# Patient Record
Sex: Female | Born: 1950 | Race: Asian | Hispanic: No | Marital: Married | Smoking: Never smoker
Health system: Southern US, Community
[De-identification: ages and names within clinical notes are randomized; demographics above are authoritative.]

## PROBLEM LIST (undated history)

## (undated) DIAGNOSIS — I10 Essential (primary) hypertension: Secondary | ICD-10-CM

## (undated) HISTORY — DX: Essential (primary) hypertension: I10

---

## 2003-03-15 ENCOUNTER — Other Ambulatory Visit: Admission: RE | Admit: 2003-03-15 | Discharge: 2003-03-15 | Payer: Self-pay | Admitting: Obstetrics and Gynecology

## 2004-10-30 ENCOUNTER — Ambulatory Visit: Payer: Self-pay | Admitting: Family Medicine

## 2004-11-05 ENCOUNTER — Ambulatory Visit: Payer: Self-pay | Admitting: Family Medicine

## 2004-12-06 ENCOUNTER — Ambulatory Visit: Payer: Self-pay | Admitting: Family Medicine

## 2004-12-21 ENCOUNTER — Encounter: Admission: RE | Admit: 2004-12-21 | Discharge: 2004-12-21 | Payer: Self-pay | Admitting: Family Medicine

## 2005-01-07 ENCOUNTER — Ambulatory Visit: Payer: Self-pay | Admitting: Family Medicine

## 2005-03-27 ENCOUNTER — Other Ambulatory Visit: Admission: RE | Admit: 2005-03-27 | Discharge: 2005-03-27 | Payer: Self-pay | Admitting: Family Medicine

## 2005-03-27 ENCOUNTER — Encounter (INDEPENDENT_AMBULATORY_CARE_PROVIDER_SITE_OTHER): Payer: Self-pay | Admitting: *Deleted

## 2005-03-27 ENCOUNTER — Ambulatory Visit: Payer: Self-pay | Admitting: Family Medicine

## 2006-01-13 ENCOUNTER — Encounter: Admission: RE | Admit: 2006-01-13 | Discharge: 2006-01-13 | Payer: Self-pay | Admitting: Family Medicine

## 2006-03-04 ENCOUNTER — Other Ambulatory Visit: Admission: RE | Admit: 2006-03-04 | Discharge: 2006-03-04 | Payer: Self-pay | Admitting: Family Medicine

## 2006-03-04 ENCOUNTER — Ambulatory Visit: Payer: Self-pay | Admitting: Family Medicine

## 2006-03-04 ENCOUNTER — Encounter (INDEPENDENT_AMBULATORY_CARE_PROVIDER_SITE_OTHER): Payer: Self-pay | Admitting: *Deleted

## 2006-03-04 LAB — CONVERTED CEMR LAB
ALT: 23 units/L (ref 0–40)
AST: 26 units/L (ref 0–37)
Albumin: 3.6 g/dL (ref 3.5–5.2)
Alkaline Phosphatase: 69 units/L (ref 39–117)
BUN: 13 mg/dL (ref 6–23)
CO2: 31 meq/L (ref 19–32)
Calcium: 9.6 mg/dL (ref 8.4–10.5)
Chloride: 106 meq/L (ref 96–112)
Chol/HDL Ratio, serum: 4.6
Cholesterol: 210 mg/dL (ref 0–200)
Creatinine, Ser: 0.9 mg/dL (ref 0.4–1.2)
GFR calc non Af Amer: 70 mL/min
Glomerular Filtration Rate, Af Am: 84 mL/min/{1.73_m2}
Glucose, Bld: 114 mg/dL — ABNORMAL HIGH (ref 70–99)
HCT: 38.3 % (ref 36.0–46.0)
HDL: 45.2 mg/dL (ref >39.0)
Hemoglobin: 13.2 g/dL (ref 12.0–15.0)
Hgb A1c MFr Bld: 5.7 % (ref 4.6–6.0)
LDL DIRECT: 119.2 mg/dL
MCHC: 34.4 g/dL (ref 30.0–36.0)
MCV: 90.8 fL (ref 78.0–100.0)
Platelets: 235 10*3/uL (ref 150–400)
Potassium: 4.2 meq/L (ref 3.5–5.1)
RBC: 4.21 M/uL (ref 3.87–5.11)
RDW: 12 % (ref 11.5–14.6)
Sodium: 142 meq/L (ref 135–145)
TSH: 1.55 microintl units/mL (ref 0.35–5.50)
Total Bilirubin: 0.8 mg/dL (ref 0.3–1.2)
Total Protein: 7.3 g/dL (ref 6.0–8.3)
Triglyceride fasting, serum: 243 mg/dL (ref 0–149)
VLDL: 49 mg/dL — ABNORMAL HIGH (ref 0–40)
WBC: 5 10*3/uL (ref 4.5–10.5)

## 2007-01-15 ENCOUNTER — Encounter: Admission: RE | Admit: 2007-01-15 | Discharge: 2007-01-15 | Payer: Self-pay | Admitting: Family Medicine

## 2008-01-19 ENCOUNTER — Encounter: Admission: RE | Admit: 2008-01-19 | Discharge: 2008-01-19 | Payer: Self-pay | Admitting: Family Medicine

## 2009-01-23 ENCOUNTER — Ambulatory Visit (HOSPITAL_COMMUNITY): Admission: RE | Admit: 2009-01-23 | Discharge: 2009-01-23 | Payer: Self-pay | Admitting: Family Medicine

## 2010-02-14 ENCOUNTER — Ambulatory Visit (HOSPITAL_COMMUNITY): Admission: RE | Admit: 2010-02-14 | Discharge: 2010-02-14 | Payer: Self-pay | Admitting: Internal Medicine

## 2010-02-19 ENCOUNTER — Encounter: Payer: Self-pay | Admitting: Family Medicine

## 2010-02-19 LAB — CONVERTED CEMR LAB
ALT: 11 units/L (ref 0–35)
AST: 17 units/L (ref 0–37)
Albumin: 4.5 g/dL (ref 3.5–5.2)
Alkaline Phosphatase: 75 units/L (ref 39–117)
BUN: 16 mg/dL (ref 6–23)
Basophils Absolute: 0 10*3/uL (ref 0.0–0.1)
Basophils Relative: 0 % (ref 0–1)
CO2: 27 meq/L (ref 19–32)
Calcium: 10 mg/dL (ref 8.4–10.5)
Chloride: 105 meq/L (ref 96–112)
Cholesterol: 221 mg/dL — ABNORMAL HIGH (ref 0–200)
Creatinine, Ser: 0.73 mg/dL (ref 0.40–1.20)
Eosinophils Absolute: 0.1 10*3/uL (ref 0.0–0.7)
Eosinophils Relative: 2 % (ref 0–5)
Glucose, Bld: 98 mg/dL (ref 70–99)
HCT: 41.2 % (ref 36.0–46.0)
HDL: 54 mg/dL (ref >39)
Hemoglobin: 13.3 g/dL (ref 12.0–15.0)
LDL Cholesterol: 139 mg/dL — ABNORMAL HIGH (ref 0–99)
Lymphocytes Relative: 51 % — ABNORMAL HIGH (ref 12–46)
Lymphs Abs: 2.6 10*3/uL (ref 0.7–4.0)
MCHC: 32.3 g/dL (ref 30.0–36.0)
MCV: 93.2 fL (ref 78.0–100.0)
Monocytes Absolute: 0.3 10*3/uL (ref 0.1–1.0)
Monocytes Relative: 6 % (ref 3–12)
Neutro Abs: 2.1 10*3/uL (ref 1.7–7.7)
Neutrophils Relative %: 41 % — ABNORMAL LOW (ref 43–77)
Platelets: 247 10*3/uL (ref 150–400)
Potassium: 4.8 meq/L (ref 3.5–5.3)
RBC: 4.42 M/uL (ref 3.87–5.11)
RDW: 13 % (ref 11.5–15.5)
Sodium: 141 meq/L (ref 135–145)
TSH: 1.328 microintl units/mL (ref 0.350–4.500)
Total Bilirubin: 0.6 mg/dL (ref 0.3–1.2)
Total CHOL/HDL Ratio: 4.1
Total Protein: 7.7 g/dL (ref 6.0–8.3)
Triglycerides: 140 mg/dL (ref <150)
VLDL: 28 mg/dL (ref 0–40)
WBC: 5.1 10*3/uL (ref 4.0–10.5)

## 2011-01-09 ENCOUNTER — Other Ambulatory Visit (HOSPITAL_COMMUNITY): Payer: Self-pay | Admitting: Family Medicine

## 2011-01-09 DIAGNOSIS — Z1231 Encounter for screening mammogram for malignant neoplasm of breast: Secondary | ICD-10-CM

## 2011-02-18 ENCOUNTER — Ambulatory Visit (HOSPITAL_COMMUNITY)
Admission: RE | Admit: 2011-02-18 | Discharge: 2011-02-18 | Disposition: A | Discharge status: Home / Self Care | Payer: Self-pay | Source: Ambulatory Visit | Attending: Family Medicine | Admitting: Family Medicine

## 2011-02-18 DIAGNOSIS — Z1231 Encounter for screening mammogram for malignant neoplasm of breast: Secondary | ICD-10-CM | POA: Insufficient documentation

## 2011-02-22 ENCOUNTER — Other Ambulatory Visit: Payer: Self-pay | Admitting: Family Medicine

## 2011-02-22 DIAGNOSIS — R928 Other abnormal and inconclusive findings on diagnostic imaging of breast: Secondary | ICD-10-CM

## 2011-03-11 ENCOUNTER — Ambulatory Visit
Admission: RE | Admit: 2011-03-11 | Discharge: 2011-03-11 | Disposition: A | Discharge status: Home / Self Care | Payer: Self-pay | Source: Ambulatory Visit | Attending: Family Medicine | Admitting: Family Medicine

## 2011-03-11 DIAGNOSIS — R928 Other abnormal and inconclusive findings on diagnostic imaging of breast: Secondary | ICD-10-CM

## 2013-04-07 ENCOUNTER — Ambulatory Visit: Payer: No Typology Code available for payment source | Attending: Internal Medicine

## 2013-05-03 ENCOUNTER — Ambulatory Visit: Payer: Self-pay | Admitting: Internal Medicine

## 2013-06-03 ENCOUNTER — Encounter: Payer: Self-pay | Admitting: Family Medicine

## 2013-06-03 ENCOUNTER — Ambulatory Visit: Payer: No Typology Code available for payment source | Attending: Internal Medicine | Admitting: Family Medicine

## 2013-06-03 VITALS — BP 179/81 | HR 89 | Temp 98.6°F | Resp 14 | Ht 62.0 in | Wt 115.2 lb

## 2013-06-03 DIAGNOSIS — Z0000 Encounter for general adult medical examination without abnormal findings: Secondary | ICD-10-CM

## 2013-06-03 DIAGNOSIS — E785 Hyperlipidemia, unspecified: Secondary | ICD-10-CM

## 2013-06-03 LAB — CBC WITH DIFFERENTIAL/PLATELET
Basophils Absolute: 0 10*3/uL (ref 0.0–0.1)
Basophils Relative: 0 % (ref 0–1)
Eosinophils Absolute: 0.1 10*3/uL (ref 0.0–0.7)
Eosinophils Relative: 2 % (ref 0–5)
HCT: 40.5 % (ref 36.0–46.0)
Hemoglobin: 13.9 g/dL (ref 12.0–15.0)
Lymphocytes Relative: 43 % (ref 12–46)
Lymphs Abs: 2.9 10*3/uL (ref 0.7–4.0)
MCH: 30.3 pg (ref 26.0–34.0)
MCHC: 34.3 g/dL (ref 30.0–36.0)
MCV: 88.2 fL (ref 78.0–100.0)
Monocytes Absolute: 0.5 10*3/uL (ref 0.1–1.0)
Monocytes Relative: 7 % (ref 3–12)
NEUTROS PCT: 48 % (ref 43–77)
Neutro Abs: 3.2 10*3/uL (ref 1.7–7.7)
PLATELETS: 257 10*3/uL (ref 150–400)
RBC: 4.59 MIL/uL (ref 3.87–5.11)
RDW: 14.1 % (ref 11.5–15.5)
WBC: 6.8 10*3/uL (ref 4.0–10.5)

## 2013-06-03 LAB — POCT CBG (FASTING - GLUCOSE)-MANUAL ENTRY: Glucose Fasting, POC: 97 mg/dL (ref 70–99)

## 2013-06-03 MED ORDER — LOSARTAN POTASSIUM 100 MG PO TABS: 100.0000 mg | ORAL_TABLET | Freq: Every day | ORAL | Status: DC

## 2013-06-03 MED ORDER — DILTIAZEM HCL ER BEADS 240 MG PO CP24: 240.0000 mg | ORAL_CAPSULE | Freq: Every day | ORAL | Status: DC

## 2013-06-03 MED ORDER — PRAVASTATIN SODIUM 20 MG PO TABS: 20.0000 mg | ORAL_TABLET | Freq: Every day | ORAL | Status: DC

## 2013-06-03 NOTE — Progress Notes (Unsigned)
   Subjective:    Patient ID: Shannon Robinson, female    DOB: 09/05/1950, 63 y.o.   MRN: [id number]  HPI Pt here to establish care and have cpe. She has a h/o htn and hyperlipidemia, otherwise is healthy.   BP not well controlled. Has not had labs checked in over a year.  Has not had tdap, glucose screen, vit d screen, hep C screen, dexa, mammo, or c-scope. Says had pap last year.  Feels well, no concerns. With htn no headache shob or chest pain. No blurry vision.   Review of Systems A 12 point review of systems is negative except as per hpi.       Objective:   Physical Exam Nursing note and vitals reviewed. Constitutional: She is oriented to person, place, and time. She appears well-developed and well-nourished.  HENT:  Right Ear: External ear normal.  Left Ear: External ear normal.  Nose: Nose normal.  Mouth/Throat: Oropharynx is clear and moist. No oropharyngeal exudate.  Eyes: Conjunctivae are normal. Pupils are equal, round, and reactive to light.  Neck: Normal range of motion. Neck supple. No thyromegaly present.  Cardiovascular: Normal rate, regular rhythm and normal heart sounds.   Pulmonary/Chest: Effort normal and breath sounds normal.  Abdominal: Soft. Bowel sounds are normal. She exhibits no distension. There is no tenderness. There is no rebound.  Lymphadenopathy:    She has no cervical adenopathy.  Neurological: She is alert and oriented to person, place, and time. She has normal reflexes.  Skin: Skin is warm and dry.  Psychiatric: She has a normal mood and affect. Her behavior is normal.         Assessment & Plan:  Shannon Robinson was seen today for establish care.  Diagnoses and associated orders for this visit:  Preventative health care - DT Vaccine greater than 7yo IM - CBC with Differential - Comprehensive metabolic panel - Glucose (CBG), Fasting - Vitamin D, 25-hydroxy - TSH - Lipid Panel - Hepatitis C antibody - DG Bone Density; Future - Ambulatory referral  to Gastroenterology - MM Digital Screening; Future  Other and unspecified hyperlipidemia - : pravastatin (PRAVACHOL) 20 MG tablet; Take 1 tablet (20 mg total) by mouth daily.  Other Orders -  pravastatin (PRAVACHOL) 20 MG tablet; Take 1 tablet (20 mg total) by mouth daily. - losartan (COZAAR) 100 MG tablet; Take 1 tablet (100 mg total) by mouth daily. - diltiazem (TIAZAC) 240 MG 24 hr capsule; Take 1 capsule (240 mg total) by mouth daily.

## 2013-06-03 NOTE — Patient Instructions (Signed)

## 2013-06-03 NOTE — Progress Notes (Unsigned)
Patient is here to establish care. Requests a physical exam and and lab work for Lipid, CMP, diabetes, Thyroid. Also requests refills on her medications. Patient has an interpreter.

## 2013-06-04 ENCOUNTER — Other Ambulatory Visit: Payer: Self-pay | Admitting: *Deleted

## 2013-06-04 DIAGNOSIS — E7800 Pure hypercholesterolemia, unspecified: Secondary | ICD-10-CM

## 2013-06-04 DIAGNOSIS — E785 Hyperlipidemia, unspecified: Secondary | ICD-10-CM

## 2013-06-04 LAB — COMPREHENSIVE METABOLIC PANEL
ALBUMIN: 4 g/dL (ref 3.5–5.2)
ALT: 12 U/L (ref 0–35)
AST: 18 U/L (ref 0–37)
Alkaline Phosphatase: 67 U/L (ref 39–117)
BUN: 12 mg/dL (ref 6–23)
CALCIUM: 9.7 mg/dL (ref 8.4–10.5)
CO2: 31 meq/L (ref 19–32)
Chloride: 104 mEq/L (ref 96–112)
Creat: 0.65 mg/dL (ref 0.50–1.10)
Glucose, Bld: 92 mg/dL (ref 70–99)
POTASSIUM: 4.5 meq/L (ref 3.5–5.3)
Sodium: 142 mEq/L (ref 135–145)
TOTAL PROTEIN: 7.4 g/dL (ref 6.0–8.3)
Total Bilirubin: 0.6 mg/dL (ref 0.2–1.2)

## 2013-06-04 LAB — VITAMIN D 25 HYDROXY (VIT D DEFICIENCY, FRACTURES): Vit D, 25-Hydroxy: 19 ng/mL — ABNORMAL LOW (ref 30–89)

## 2013-06-04 LAB — TSH: TSH: 1.668 u[IU]/mL (ref 0.350–4.500)

## 2013-06-04 LAB — LIPID PANEL
Cholesterol: 245 mg/dL — ABNORMAL HIGH (ref 0–200)
HDL: 50 mg/dL (ref >39)
LDL Cholesterol: 119 mg/dL — ABNORMAL HIGH (ref 0–99)
Total CHOL/HDL Ratio: 4.9 Ratio
Triglycerides: 378 mg/dL — ABNORMAL HIGH (ref <150)
VLDL: 76 mg/dL — ABNORMAL HIGH (ref 0–40)

## 2013-06-04 LAB — HEPATITIS C ANTIBODY: HCV AB: NEGATIVE

## 2013-06-04 MED ORDER — PRAVASTATIN SODIUM 20 MG PO TABS: 20.0000 mg | ORAL_TABLET | Freq: Every day | ORAL | Status: DC

## 2013-06-09 ENCOUNTER — Telehealth: Payer: Self-pay

## 2013-06-09 ENCOUNTER — Other Ambulatory Visit: Payer: Self-pay | Admitting: Family Medicine

## 2013-06-09 DIAGNOSIS — E559 Vitamin D deficiency, unspecified: Secondary | ICD-10-CM

## 2013-06-09 DIAGNOSIS — E7800 Pure hypercholesterolemia, unspecified: Secondary | ICD-10-CM

## 2013-06-09 DIAGNOSIS — E785 Hyperlipidemia, unspecified: Secondary | ICD-10-CM

## 2013-06-09 MED ORDER — CHOLECALCIFEROL 1.25 MG (50000 UT) PO CAPS: ORAL_CAPSULE | ORAL | Status: DC

## 2013-06-09 MED ORDER — PRAVASTATIN SODIUM 40 MG PO TABS: 20.0000 mg | ORAL_TABLET | Freq: Every day | ORAL | Status: DC

## 2013-06-09 NOTE — Telephone Encounter (Signed)
Interpreter line used Patient not available Message left to return call

## 2013-06-09 NOTE — Telephone Encounter (Signed)
Message copied by Lestine MountJUAREZ, Ryen Rhames L on Wed Jun 09, 2013 12:24 PM ------      Message from: Acey LavWOOD, ALLISON L      Created: Wed Jun 09, 2013 12:06 PM       Please let pt know vit D low and cholesterol high. im sending in meds for her vit D and will increase her statin. F/u as scheduled. Thanks AW ------

## 2013-07-01 ENCOUNTER — Ambulatory Visit (HOSPITAL_COMMUNITY)
Admission: RE | Admit: 2013-07-01 | Discharge: 2013-07-01 | Disposition: A | Discharge status: Home / Self Care | Payer: No Typology Code available for payment source | Source: Ambulatory Visit | Attending: Family Medicine | Admitting: Family Medicine

## 2013-07-01 DIAGNOSIS — Z0000 Encounter for general adult medical examination without abnormal findings: Secondary | ICD-10-CM

## 2013-07-01 DIAGNOSIS — Z1231 Encounter for screening mammogram for malignant neoplasm of breast: Secondary | ICD-10-CM | POA: Insufficient documentation

## 2013-07-01 DIAGNOSIS — Z780 Asymptomatic menopausal state: Secondary | ICD-10-CM | POA: Insufficient documentation

## 2013-07-01 DIAGNOSIS — Z13820 Encounter for screening for osteoporosis: Secondary | ICD-10-CM | POA: Insufficient documentation

## 2013-07-22 ENCOUNTER — Telehealth: Payer: Self-pay

## 2013-07-22 NOTE — Telephone Encounter (Signed)
Interpreter line used Patient not available Unable to leave message No voice mail

## 2013-07-22 NOTE — Telephone Encounter (Signed)
Message copied by Lestine MountJUAREZ, Leani Myron L on Thu Jul 22, 2013  2:30 PM ------      Message from: Acey LavWOOD, ALLISON L      Created: Wed Jul 21, 2013  8:17 PM       Please let pt know she has osteopenia. She should take a daily calcium with D if not already. F/u at [facility] to discuss options to avoid opsteoporosis. ------

## 2013-12-01 ENCOUNTER — Ambulatory Visit: Payer: Self-pay | Admitting: Internal Medicine

## 2013-12-14 ENCOUNTER — Ambulatory Visit: Payer: No Typology Code available for payment source | Attending: Internal Medicine | Admitting: Internal Medicine

## 2013-12-14 VITALS — BP 171/85 | HR 75 | Temp 97.9°F | Wt 112.6 lb

## 2013-12-14 DIAGNOSIS — E785 Hyperlipidemia, unspecified: Secondary | ICD-10-CM | POA: Insufficient documentation

## 2013-12-14 DIAGNOSIS — I10 Essential (primary) hypertension: Secondary | ICD-10-CM | POA: Insufficient documentation

## 2013-12-14 MED ORDER — SIMVASTATIN 10 MG PO TABS: 10.0000 mg | ORAL_TABLET | Freq: Every day | ORAL | Status: DC

## 2013-12-14 MED ORDER — DILTIAZEM HCL ER BEADS 300 MG PO CP24: 300.0000 mg | ORAL_CAPSULE | Freq: Every day | ORAL | Status: DC

## 2013-12-14 MED ORDER — LOSARTAN POTASSIUM 100 MG PO TABS: 100.0000 mg | ORAL_TABLET | Freq: Every day | ORAL | Status: DC

## 2013-12-14 NOTE — Progress Notes (Signed)
MRN: [id number] Name: Shannon Robinson  Sex: female Age: 63 y.o. DOB: [birth year]  Allergies: Review of patient's allergies indicates no known allergies.  Chief Complaint  Patient presents with  . Follow-up    HPI: Patient is 63 y.o. female who has to of hypertension hyperlipidemia comes today for followup, as per patient she has been taking her medications regularly her blood pressure is borderline elevated, she denies any headache dizziness chest and shortness of breath currently she's taking Cozaar 100 mg daily and diltiazem 240 mg daily, she denies any history of chest pain shortness of breath or any palpitation. Patient denies smoking cigarettes.  Past Medical History  Diagnosis Date  . Hypertension     No past surgical history on file.    Medication List       This list is accurate as of: 12/14/13 12:45 PM.  Always use your most recent med list.               Cholecalciferol 50000 UNITS capsule  One weekly for 8 weeks then recheck     diltiazem 300 MG 24 hr capsule  Commonly known as:  TIAZAC  Take 1 capsule (300 mg total) by mouth daily.     losartan 100 MG tablet  Commonly known as:  COZAAR  Take 1 tablet (100 mg total) by mouth daily.     simvastatin 10 MG tablet  Commonly known as:  ZOCOR  Take 1 tablet (10 mg total) by mouth daily.        Meds ordered this encounter  Medications  . DISCONTD: simvastatin (ZOCOR) 10 MG tablet    Sig: Take 10 mg by mouth daily.  Marland Kitchen losartan (COZAAR) 100 MG tablet    Sig: Take 1 tablet (100 mg total) by mouth daily.    Dispense:  90 tablet    Refill:  1  . diltiazem (TIAZAC) 300 MG 24 hr capsule    Sig: Take 1 capsule (300 mg total) by mouth daily.    Dispense:  90 capsule    Refill:  1  . simvastatin (ZOCOR) 10 MG tablet    Sig: Take 1 tablet (10 mg total) by mouth daily.    Dispense:  30 tablet    Refill:  3    There is no immunization history for the selected administration types on file for this patient.  No  family history on file.  History  Substance Use Topics  . Smoking status: Never Smoker   . Smokeless tobacco: Not on file  . Alcohol Use: No    Review of Systems   As noted in HPI  Filed Vitals:   12/14/13 1221  BP: 171/85  Pulse: 75  Temp: 97.9 F (36.6 C)    Physical Exam  Physical Exam  Constitutional: No distress.  Eyes: EOM are normal. Pupils are equal, round, and reactive to light.  Cardiovascular: Normal rate and regular rhythm.   Pulmonary/Chest: Breath sounds normal. No respiratory distress. She has no wheezes. She has no rales.  Musculoskeletal: She exhibits no edema.    CBC    Component Value Date/Time   WBC 6.8 06/03/2013 1032   RBC 4.59 06/03/2013 1032   HGB 13.9 06/03/2013 1032   HCT 40.5 06/03/2013 1032   PLT 257 06/03/2013 1032   MCV 88.2 06/03/2013 1032   LYMPHSABS 2.9 06/03/2013 1032   MONOABS 0.5 06/03/2013 1032   EOSABS 0.1 06/03/2013 1032   BASOSABS 0.0 06/03/2013 1032    CMP  Component Value Date/Time   NA 142 06/03/2013 1032   K 4.5 06/03/2013 1032   CL 104 06/03/2013 1032   CO2 31 06/03/2013 1032   GLUCOSE 92 06/03/2013 1032   GLUCOSE 114* 03/04/2006 1145   BUN 12 06/03/2013 1032   CREATININE 0.65 06/03/2013 1032   CREATININE 0.73 02/19/2010 2203   CALCIUM 9.7 06/03/2013 1032   PROT 7.4 06/03/2013 1032   ALBUMIN 4.0 06/03/2013 1032   AST 18 06/03/2013 1032   ALT 12 06/03/2013 1032   ALKPHOS 67 06/03/2013 1032   BILITOT 0.6 06/03/2013 1032   GFRNONAA 70 03/04/2006 1145    Lab Results  Component Value Date/Time   CHOL 245* 06/03/2013 10:32 AM    No components found with this basename: hga1c    Lab Results  Component Value Date/Time   AST 18 06/03/2013 10:32 AM    Assessment and Plan  Other and unspecified hyperlipidemia - Plan: , Currently patient is on simvastatin (ZOCOR) 10 MG tablet, will repeat fasting Lipid panel  Essential hypertension, benign - Plan:  Blood pressure is uncontrolled, I have advised patient for DASH diet,  she'll continue with losartan (COZAAR) 100 MG tablet, I have increased the dose of diltiazem (TIAZAC) 300 MG 24 hr capsule will repeat blood chemistry COMPLETE METABOLIC PANEL WITH GFR, patient will come back in 2 weeks for BP check.   Health Maintenance -Colonoscopy: Up-to-date  -Mammogram: Up-to-date    Return in about 3 months (around 03/16/2014) for hypertension, hyperipidemia, BP check and blood work in 2 weeks/Nurse Visit.  Doris Cheadle, MD

## 2013-12-14 NOTE — Patient Instructions (Signed)
DASH Eating Plan °DASH stands for "Dietary Approaches to Stop Hypertension." The DASH eating plan is a healthy eating plan that has been shown to reduce high blood pressure (hypertension). Additional health benefits may include reducing the risk of type 2 diabetes mellitus, heart disease, and stroke. The DASH eating plan may also help with weight loss. °WHAT DO I NEED TO KNOW ABOUT THE DASH EATING PLAN? °For the DASH eating plan, you will follow these general guidelines: °· Choose foods with a percent daily value for sodium of less than 5% (as listed on the food label). °· Use salt-free seasonings or herbs instead of table salt or sea salt. °· Check with your health care provider or pharmacist before using salt substitutes. °· Eat lower-sodium products, often labeled as "lower sodium" or "no salt added." °· Eat fresh foods. °· Eat more vegetables, fruits, and low-fat dairy products. °· Choose whole grains. Look for the word "whole" as the first word in the ingredient list. °· Choose fish and skinless chicken or turkey more often than red meat. Limit fish, poultry, and meat to 6 oz (170 g) each day. °· Limit sweets, desserts, sugars, and sugary drinks. °· Choose heart-healthy fats. °· Limit cheese to 1 oz (28 g) per day. °· Eat more home-cooked food and less restaurant, buffet, and fast food. °· Limit fried foods. °· Cook foods using methods other than frying. °· Limit canned vegetables. If you do use them, rinse them well to decrease the sodium. °· When eating at a restaurant, ask that your food be prepared with less salt, or no salt if possible. °WHAT FOODS CAN I EAT? °Seek help from a dietitian for individual calorie needs. °Grains °Whole grain or whole wheat bread. Brown rice. Whole grain or whole wheat pasta. Quinoa, bulgur, and whole grain cereals. Low-sodium cereals. Corn or whole wheat flour tortillas. Whole grain cornbread. Whole grain crackers. Low-sodium crackers. °Vegetables °Fresh or frozen vegetables  (raw, steamed, roasted, or grilled). Low-sodium or reduced-sodium tomato and vegetable juices. Low-sodium or reduced-sodium tomato sauce and paste. Low-sodium or reduced-sodium canned vegetables.  °Fruits °All fresh, canned (in natural juice), or frozen fruits. °Meat and Other Protein Products °Ground beef (85% or leaner), grass-fed beef, or beef trimmed of fat. Skinless chicken or turkey. Ground chicken or turkey. Pork trimmed of fat. All fish and seafood. Eggs. Dried beans, peas, or lentils. Unsalted nuts and seeds. Unsalted canned beans. °Dairy °Low-fat dairy products, such as skim or 1% milk, 2% or reduced-fat cheeses, low-fat ricotta or cottage cheese, or plain low-fat yogurt. Low-sodium or reduced-sodium cheeses. °Fats and Oils °Tub margarines without trans fats. Light or reduced-fat mayonnaise and salad dressings (reduced sodium). Avocado. Safflower, olive, or canola oils. Natural peanut or almond butter. °Other °Unsalted popcorn and pretzels. °The items listed above may not be a complete list of recommended foods or beverages. Contact your dietitian for more options. °WHAT FOODS ARE NOT RECOMMENDED? °Grains °White bread. White pasta. White rice. Refined cornbread. Bagels and croissants. Crackers that contain trans fat. °Vegetables °Creamed or fried vegetables. Vegetables in a cheese sauce. Regular canned vegetables. Regular canned tomato sauce and paste. Regular tomato and vegetable juices. °Fruits °Dried fruits. Canned fruit in light or heavy syrup. Fruit juice. °Meat and Other Protein Products °Fatty cuts of meat. Ribs, chicken wings, bacon, sausage, bologna, salami, chitterlings, fatback, hot dogs, bratwurst, and packaged luncheon meats. Salted nuts and seeds. Canned beans with salt. °Dairy °Whole or 2% milk, cream, half-and-half, and cream cheese. Whole-fat or sweetened yogurt. Full-fat   cheeses or blue cheese. Nondairy creamers and whipped toppings. Processed cheese, cheese spreads, or cheese  curds. °Condiments °Onion and garlic salt, seasoned salt, table salt, and sea salt. Canned and packaged gravies. Worcestershire sauce. Tartar sauce. Barbecue sauce. Teriyaki sauce. Soy sauce, including reduced sodium. Steak sauce. Fish sauce. Oyster sauce. Cocktail sauce. Horseradish. Ketchup and mustard. Meat flavorings and tenderizers. Bouillon cubes. Hot sauce. Tabasco sauce. Marinades. Taco seasonings. Relishes. °Fats and Oils °Butter, stick margarine, lard, shortening, ghee, and bacon fat. Coconut, palm kernel, or palm oils. Regular salad dressings. °Other °Pickles and olives. Salted popcorn and pretzels. °The items listed above may not be a complete list of foods and beverages to avoid. Contact your dietitian for more information. °WHERE CAN I FIND MORE INFORMATION? °National Heart, [employer], and [employer] Institute: www.nhlbi.nih.gov/health/health-topics/topics/dash/ °Document Released: 03/28/2011 Document Revised: 08/23/2013 Document Reviewed: 02/10/2013 °Exit[employer]® Patient Information ©2015 [employer]. This information is not intended to replace advice given to you by your health care provider. Make sure you discuss any questions you have with your health care provider. ° °

## 2013-12-14 NOTE — Progress Notes (Signed)
Patient is here with interpreter Follow up visit for cholesterol and BP check Lt arm 171/85 BP rechecked in rt arm 176/85

## 2013-12-28 ENCOUNTER — Ambulatory Visit: Payer: No Typology Code available for payment source | Attending: Internal Medicine

## 2013-12-28 VITALS — BP 168/79

## 2013-12-28 DIAGNOSIS — E785 Hyperlipidemia, unspecified: Secondary | ICD-10-CM

## 2013-12-28 DIAGNOSIS — Z23 Encounter for immunization: Secondary | ICD-10-CM

## 2013-12-28 DIAGNOSIS — I10 Essential (primary) hypertension: Secondary | ICD-10-CM

## 2013-12-28 LAB — LIPID PANEL
CHOL/HDL RATIO: 4.4 ratio
CHOLESTEROL: 266 mg/dL — AB (ref 0–200)
HDL: 60 mg/dL (ref >39)
LDL Cholesterol: 163 mg/dL — ABNORMAL HIGH (ref 0–99)
TRIGLYCERIDES: 216 mg/dL — AB (ref <150)
VLDL: 43 mg/dL — ABNORMAL HIGH (ref 0–40)

## 2013-12-28 LAB — COMPLETE METABOLIC PANEL WITH GFR
ALT: 18 U/L (ref 0–35)
AST: 21 U/L (ref 0–37)
Albumin: 4.7 g/dL (ref 3.5–5.2)
Alkaline Phosphatase: 75 U/L (ref 39–117)
BUN: 17 mg/dL (ref 6–23)
CO2: 29 meq/L (ref 19–32)
CREATININE: 0.72 mg/dL (ref 0.50–1.10)
Calcium: 10.1 mg/dL (ref 8.4–10.5)
Chloride: 103 mEq/L (ref 96–112)
GLUCOSE: 104 mg/dL — AB (ref 70–99)
Potassium: 4.3 mEq/L (ref 3.5–5.3)
Sodium: 140 mEq/L (ref 135–145)
Total Bilirubin: 0.6 mg/dL (ref 0.2–1.2)
Total Protein: 8.1 g/dL (ref 6.0–8.3)

## 2013-12-28 NOTE — Progress Notes (Unsigned)
Pt is here today for BP check. Today pt's BP is 179/81

## 2013-12-28 NOTE — Patient Instructions (Signed)
Continue taking medications as prescribed. You are to return in 2 weeks to check your BP again and this time when you come take your medications before you get here.

## 2014-01-11 ENCOUNTER — Ambulatory Visit: Payer: No Typology Code available for payment source | Attending: Internal Medicine | Admitting: *Deleted

## 2014-01-11 VITALS — BP 156/69 | HR 85 | Resp 22

## 2014-01-11 DIAGNOSIS — I10 Essential (primary) hypertension: Secondary | ICD-10-CM | POA: Insufficient documentation

## 2014-01-11 DIAGNOSIS — Z09 Encounter for follow-up examination after completed treatment for conditions other than malignant neoplasm: Secondary | ICD-10-CM | POA: Diagnosis present

## 2014-01-11 NOTE — Progress Notes (Signed)
Patient presents with interpreter for BP check States taking all meds as directed. BP at last visit was 171/85  VS at present: BP 156/69 P 85 SPO2  100%  Patient instructed to call for med refills at least 7 days  prior to running out so as not to go without. Patient states understanding.

## 2015-04-13 ENCOUNTER — Encounter: Payer: Self-pay | Admitting: Family Medicine

## 2015-04-13 ENCOUNTER — Ambulatory Visit: Payer: 59 | Attending: Family Medicine | Admitting: Family Medicine

## 2015-04-13 VITALS — BP 136/70 | HR 75 | Temp 98.0°F | Resp 16 | Ht 62.0 in | Wt 119.0 lb

## 2015-04-13 DIAGNOSIS — Z114 Encounter for screening for human immunodeficiency virus [HIV]: Secondary | ICD-10-CM | POA: Insufficient documentation

## 2015-04-13 DIAGNOSIS — IMO0002 Reserved for concepts with insufficient information to code with codable children: Secondary | ICD-10-CM | POA: Insufficient documentation

## 2015-04-13 DIAGNOSIS — E559 Vitamin D deficiency, unspecified: Secondary | ICD-10-CM | POA: Diagnosis not present

## 2015-04-13 DIAGNOSIS — E785 Hyperlipidemia, unspecified: Secondary | ICD-10-CM | POA: Insufficient documentation

## 2015-04-13 DIAGNOSIS — Z79899 Other long term (current) drug therapy: Secondary | ICD-10-CM | POA: Diagnosis not present

## 2015-04-13 DIAGNOSIS — Z0000 Encounter for general adult medical examination without abnormal findings: Secondary | ICD-10-CM | POA: Insufficient documentation

## 2015-04-13 DIAGNOSIS — M779 Enthesopathy, unspecified: Secondary | ICD-10-CM | POA: Diagnosis not present

## 2015-04-13 DIAGNOSIS — M79602 Pain in left arm: Secondary | ICD-10-CM | POA: Insufficient documentation

## 2015-04-13 DIAGNOSIS — I10 Essential (primary) hypertension: Secondary | ICD-10-CM | POA: Diagnosis not present

## 2015-04-13 LAB — COMPLETE METABOLIC PANEL WITH GFR
ALK PHOS: 80 U/L (ref 33–130)
ALT: 17 U/L (ref 6–29)
AST: 20 U/L (ref 10–35)
Albumin: 4.6 g/dL (ref 3.6–5.1)
BUN: 20 mg/dL (ref 7–25)
CO2: 27 mmol/L (ref 20–31)
Calcium: 10.2 mg/dL (ref 8.6–10.4)
Chloride: 102 mmol/L (ref 98–110)
Creat: 0.87 mg/dL (ref 0.50–0.99)
GFR, Est African American: 83 mL/min (ref >=60)
GFR, Est Non African American: 72 mL/min (ref >=60)
Glucose, Bld: 92 mg/dL (ref 65–99)
Potassium: 4.2 mmol/L (ref 3.5–5.3)
Sodium: 137 mmol/L (ref 135–146)
Total Bilirubin: 0.5 mg/dL (ref 0.2–1.2)
Total Protein: 8 g/dL (ref 6.1–8.1)

## 2015-04-13 LAB — LIPID PANEL
CHOL/HDL RATIO: 4.5 ratio (ref <=5.0)
Cholesterol: 243 mg/dL — ABNORMAL HIGH (ref 125–200)
HDL: 54 mg/dL (ref >=46)
LDL CALC: 124 mg/dL (ref <130)
Triglycerides: 327 mg/dL — ABNORMAL HIGH (ref <150)
VLDL: 65 mg/dL — ABNORMAL HIGH (ref <30)

## 2015-04-13 LAB — HIV ANTIBODY (ROUTINE TESTING W REFLEX): HIV 1&2 Ab, 4th Generation: NONREACTIVE

## 2015-04-13 MED ORDER — PRAVASTATIN SODIUM 20 MG PO TABS: 20.0000 mg | ORAL_TABLET | Freq: Every day | ORAL | Status: DC

## 2015-04-13 MED ORDER — ZOSTER VACCINE LIVE 19400 UNT/0.65ML ~~LOC~~ SOLR: 0.6500 mL | Freq: Once | SUBCUTANEOUS | Status: AC

## 2015-04-13 MED ORDER — DICLOFENAC SODIUM 1 % TD GEL: 2.0000 g | Freq: Four times a day (QID) | TRANSDERMAL | Status: DC

## 2015-04-13 NOTE — Assessment & Plan Note (Signed)
Lipid check

## 2015-04-13 NOTE — Progress Notes (Signed)
F/U HTN  No medication x 1 month C/C lt arm pain with movement  Pain scale #9 No tobacco user  No suicide thought in the past two weeks   Used [employer] Interpreter Vietnamese [phone]#[id number]

## 2015-04-13 NOTE — Assessment & Plan Note (Signed)
A: HTN hx, normotensive now  P: CMP Did not refill losartan or diltiazem  Home BP monitoring  Close f/u

## 2015-04-13 NOTE — Assessment & Plan Note (Signed)
Vit D check

## 2015-04-13 NOTE — Progress Notes (Signed)
Patient ID: Shannon Robinson, female   DOB: 1950/11/26, 64 y.o.   MRN: [id number]   Subjective:  Patient ID: Shannon Robinson, female    DOB: 01-May-1950  Age: 64 y.o. MRN: [id number] [place] interpreter used  CC: Hypertension and Arm Pain   HPI Shannon Robinson presents for    1. CHRONIC HYPERTENSION  Disease Monitoring  Blood pressure range: not checking   Chest pain: no   Dyspnea: no   Claudication: no   Medication compliance: no, no medications for one month  Medication Side Effects  Lightheadedness: no   Urinary frequency: no   Edema: no    2. L arm pain: at antecubital fossa and lateral elbow x 2 weeks. No known injury. Sometimes repetitive arm movements. No swelling. No CP or SOB. No rash.   Social History  Substance Use Topics  . Smoking status: Never Smoker   . Smokeless tobacco: Not on file  . Alcohol Use: No    Outpatient Prescriptions Prior to Visit  Medication Sig Dispense Refill  . Cholecalciferol 50000 UNITS capsule One weekly for 8 weeks then recheck (Patient not taking: Reported on 04/13/2015) 8 capsule 0  . diltiazem (TIAZAC) 300 MG 24 hr capsule Take 1 capsule (300 mg total) by mouth daily. (Patient not taking: Reported on 04/13/2015) 90 capsule 1  . losartan (COZAAR) 100 MG tablet Take 1 tablet (100 mg total) by mouth daily. (Patient not taking: Reported on 04/13/2015) 90 tablet 1  . simvastatin (ZOCOR) 10 MG tablet Take 1 tablet (10 mg total) by mouth daily. (Patient not taking: Reported on 04/13/2015) 30 tablet 3   No facility-administered medications prior to visit.    ROS Review of Systems  Constitutional: Negative for fever and chills.  Eyes: Negative for visual disturbance.  Respiratory: Negative for shortness of breath.   Cardiovascular: Negative for chest pain.  Gastrointestinal: Negative for abdominal pain and blood in stool.  Musculoskeletal: Positive for arthralgias. Negative for back pain.  Skin: Negative for rash.  Allergic/Immunologic: Negative  for immunocompromised state.  Hematological: Negative for adenopathy. Does not bruise/bleed easily.  Psychiatric/Behavioral: Negative for suicidal ideas and dysphoric mood.    Objective:  BP 136/70 mmHg  Pulse 75  Temp(Src) 98 F (36.7 C) (Oral)  Resp 16  Ht  (1.575 m)  Wt 119 lb (53.978 kg)  BMI 21.76 kg/m2  SpO2 98%  BP/Weight 04/13/2015 01/11/2014 12/28/2013  Systolic BP 136 156 168  Diastolic BP 70 69 79  Wt. (Lbs) 119 - -  BMI 21.76 - -   Physical Exam  Constitutional: She is oriented to person, place, and time. She appears well-developed and well-nourished. No distress.  HENT:  Head: Normocephalic and atraumatic.  Cardiovascular: Normal rate, regular rhythm, normal heart sounds and intact distal pulses.   Pulmonary/Chest: Effort normal and breath sounds normal.  Musculoskeletal: She exhibits no edema.       Left elbow: She exhibits normal range of motion, no swelling, no effusion, no deformity and no laceration. No tenderness found. No radial head, no medial epicondyle, no lateral epicondyle and no olecranon process tenderness noted.  L elbow pain is reproducible with pronation of arm against resistance   Neurological: She is alert and oriented to person, place, and time.  Skin: Skin is warm and dry. No rash noted.  Psychiatric: She has a normal mood and affect.    Assessment & Plan:   Problem List Items Addressed This Visit    Epicondylitis   Relevant Medications  diclofenac sodium (VOLTAREN) 1 % GEL   Essential hypertension, benign   Relevant Medications   pravastatin (PRAVACHOL) 20 MG tablet   Other Relevant Orders   COMPLETE METABOLIC PANEL WITH GFR   Hyperlipidemia   Relevant Medications   pravastatin (PRAVACHOL) 20 MG tablet   Other Relevant Orders   Lipid Panel   Vitamin D deficiency   Relevant Orders   Vitamin D, 25-hydroxy    Other Visit Diagnoses    Healthcare maintenance    -  Primary    Relevant Medications    zoster vaccine live, PF,  (ZOSTAVAX) [id number] UNT/0.65ML injection    Other Relevant Orders    Flu Vaccine QUAD 36+ mos IM       No orders of the defined types were placed in this encounter.    Follow-up: No Follow-up on file.   Dessa PhiJosalyn Markies Mowatt MD

## 2015-04-13 NOTE — Patient Instructions (Addendum)
Shannon Robinson was seen today for hypertension and arm pain.  Diagnoses and all orders for this visit:  Healthcare maintenance -     Flu Vaccine QUAD 36+ mos IM -     zoster vaccine live, PF, (ZOSTAVAX) 16109 UNT/0.65ML injection; Inject 19,400 Units into the skin once.  Hyperlipidemia -     Lipid Panel -     pravastatin (PRAVACHOL) 20 MG tablet; Take 1 tablet (20 mg total) by mouth daily.  Essential hypertension, benign -     COMPLETE METABOLIC PANEL WITH GFR  Vitamin D deficiency -     Vitamin D, 25-hydroxy  Epicondylitis -     diclofenac sodium (VOLTAREN) 1 % GEL; Apply 2 g topically 4 (four) times daily.   Check BP at home, I recommend home BP cuff or drug store check pressure and pulse and write down readings  F/u in 2 weeks for pharmacy BP check F/u with me in 4-6 weeks for pap smear   Dr. Armen Pickup  Medial Epicondylitis With Rehab Medial epicondylitis involves inflammation and pain around the inner (medial) portion of the elbow. This pain is caused by inflammation of the tendons in the forearm that flex (bring down) the wrist. Medial epicondylitis is also called golfer's elbow, because it is common among golfers. However, it may occur in any individual who flexes the wrist regularly. If medial epicondylitis is left untreated, it may become a chronic problem. SYMPTOMS   Pain, tenderness, or inflammation over the inner (medial) side of the elbow.  Pain or weakness with gripping activities.  Pain that increases with wrist twisting motions (using a screwdriver, playing golf, bowling). CAUSES  Medial epicondylitis is caused by inflammation of the tendons that flex the wrist. Causes of injury may include:  Chronic, repetitive stress and strain to the tendons that run from the wrist and forearm to the elbow.  Sudden strain on the forearm, including wrist snap when serving balls with racquet sports, or throwing a baseball. RISK INCREASES WITH:  Sports or occupations that require  repetitive and/or strenuous forearm and wrist movements (pitching a baseball, golfing, carpentry).  Poor wrist and forearm strength and flexibility.  Failure to warm up properly before activity.  Resuming activity before healing, rehabilitation, and conditioning are complete. PREVENTION   Warm up and stretch properly before activity.  Maintain physical fitness:  Strength, flexibility, and endurance.  Cardiovascular fitness.  Wear and use properly fitted equipment.  Learn and use proper technique and have a coach correct improper technique.  Wear a tennis elbow (counterforce) brace. PROGNOSIS  The course of this condition depends on the degree of the injury. If treated properly, acute cases (symptoms lasting less than 4 weeks) are often resolved in 2 to 6 weeks. Chronic (longer lasting cases) often resolve in 3 to 6 months, but may require physical therapy. RELATED COMPLICATIONS   Frequently recurring symptoms, resulting in a chronic problem. Properly treating the problem the first time decreases frequency of recurrence.  Chronic inflammation, scarring, and partial tendon tear, requiring surgery.  Delayed healing or resolution of symptoms. TREATMENT  Treatment first involves the use of ice and medicine, to reduce pain and inflammation. Strengthening and stretching exercises may reduce discomfort, if performed regularly. These exercises may be performed at home, if the condition is an acute injury. Chronic cases may require a referral to a physical therapist for evaluation and treatment. Your caregiver may advise a corticosteroid injection to help reduce inflammation. Rarely, surgery is needed. MEDICATION  If pain medicine is needed,  nonsteroidal anti-inflammatory medicines (aspirin and ibuprofen), or other minor pain relievers (acetaminophen), are often advised.  Do not take pain medicine for 7 days before surgery.  Prescription pain relievers may be given, if your caregiver  thinks they are needed. Use only as directed and only as much as you need.  Corticosteroid injections may be recommended. These injections should be reserved only for the most severe cases, because they can only be given a certain number of times. HEAT AND COLD  Cold treatment (icing) should be applied for 10 to 15 minutes every 2 to 3 hours for inflammation and pain, and immediately after activity that aggravates your symptoms. Use ice packs or an ice massage.  Heat treatment may be used before performing stretching and strengthening activities prescribed by your caregiver, physical therapist, or athletic trainer. Use a heat pack or a warm water soak. SEEK MEDICAL CARE IF: Symptoms get worse or do not improve in 2 weeks, despite treatment. EXERCISES  RANGE OF MOTION (ROM) AND STRETCHING EXERCISES - Epicondylitis, Medial (Golfer's Elbow) These exercises may help you when beginning to rehabilitate your injury. Your symptoms may go away with or without further involvement from your physician, physical therapist or athletic trainer. While completing these exercises, remember:   Restoring tissue flexibility helps normal motion to return to the joints. This allows healthier, less painful movement and activity.  An effective stretch should be held for at least 30 seconds.  A stretch should never be painful. You should only feel a gentle lengthening or release in the stretched tissue. RANGE OF MOTION - Wrist Flexion, Active-Assisted  Extend your right / left elbow with your fingers pointing down.*  Gently pull the back of your hand towards you, until you feel a gentle stretch on the top of your forearm.  Hold this position for __________ seconds. Repeat __________ times. Complete this exercise __________ times per day.  *If directed by your physician, physical therapist or athletic trainer, complete this stretch with your elbow bent, rather than extended. RANGE OF MOTION - Wrist Extension,  Active-Assisted  Extend your right / left elbow and turn your palm upwards.*  Gently pull your palm and fingertips back, so your wrist extends and your fingers point more toward the ground.  You should feel a gentle stretch on the inside of your forearm.  Hold this position for __________ seconds. Repeat __________ times. Complete this exercise __________ times per day. *If directed by your physician, physical therapist or athletic trainer, complete this stretch with your elbow bent, rather than extended. STRETCH - Wrist Extension   Place your right / left fingertips on a tabletop leaving your elbow slightly bent. Your fingers should point backwards.  Gently press your fingers and palm down onto the table, by straightening your elbow. You should feel a stretch on the inside of your forearm.  Hold this position for __________ seconds. Repeat __________ times. Complete this stretch __________ times per day.  STRENGTHENING EXERCISES - Epicondylitis, Medial (Golfer's Elbow) These exercises may help you when beginning to rehabilitate your injury. They may resolve your symptoms with or without further involvement from your physician, physical therapist or athletic trainer. While completing these exercises, remember:   Muscles can gain both the endurance and the strength needed for everyday activities through controlled exercises.  Complete these exercises as instructed by your physician, physical therapist or athletic trainer. Increase the resistance and repetitions only as guided.  You may experience muscle soreness or fatigue, but the pain or discomfort you are  trying to eliminate should never worsen during these exercises. If this pain does get worse, stop and make sure you are following the directions exactly. If the pain is still present after adjustments, discontinue the exercise until you can discuss the trouble with your caregiver. STRENGTH - Wrist Flexors  Sit with your right / left  forearm palm-up, and fully supported on a table or countertop. Your elbow should be resting below the height of your shoulder. Allow your wrist to extend over the edge of the surface.  Loosely holding a __________ weight, or a piece of rubber exercise band or tubing, slowly curl your hand up toward your forearm.  Hold this position for __________ seconds. Slowly lower the wrist back to the starting position in a controlled manner. Repeat __________ times. Complete this exercise __________ times per day.  STRENGTH - Wrist Extensors  Sit with your right / left forearm palm-down and fully supported. Your elbow should be resting below the height of your shoulder. Allow your wrist to extend over the edge of the surface.  Loosely holding a __________ weight, or a piece of rubber exercise band or tubing, slowly curl your hand up toward your forearm.  Hold this position for __________ seconds. Slowly lower the wrist back to the starting position in a controlled manner. Repeat __________ times. Complete this exercise __________ times per day.  STRENGTH - Ulnar Deviators  Stand with a ____________________ weight in your right / left hand, or sit while holding a rubber exercise band or tubing, with your healthy arm supported on a table or countertop.  Move your wrist so that your pinkie travels toward your forearm and your thumb moves away from your forearm.  Hold this position for __________ seconds and then slowly lower the wrist back to the starting position. Repeat __________ times. Complete this exercise __________ times per day STRENGTH - Grip   Grasp a tennis ball, a dense sponge, or a large, rolled sock in your hand.  Squeeze as hard as you can, without increasing any pain.  Hold this position for __________ seconds. Release your grip slowly. Repeat __________ times. Complete this exercise __________ times per day.  STRENGTH - Forearm Supinators   Sit with your right / left forearm  supported on a table, keeping your elbow below shoulder height. Rest your hand over the edge, palm down.  Gently grip a hammer or a soup ladle.  Without moving your elbow, slowly turn your palm and hand upward to a "thumbs-up" position.  Hold this position for __________ seconds. Slowly return to the starting position. Repeat __________ times. Complete this exercise __________ times per day.  STRENGTH - Forearm Pronators  Sit with your right / left forearm supported on a table, keeping your elbow below shoulder height. Rest your hand over the edge, palm up.  Gently grip a hammer or a soup ladle.  Without moving your elbow, slowly turn your palm and hand upward to a "thumbs-up" position.  Hold this position for __________ seconds. Slowly return to the starting position. Repeat __________ times. Complete this exercise __________ times per day.    This information is not intended to replace advice given to you by your health care provider. Make sure you discuss any questions you have with your health care provider.   Document Released: 04/08/2005 Document Revised: 04/29/2014 Document Reviewed: 07/21/2008 Elsevier Interactive Patient Education [employer] Inc.

## 2015-04-13 NOTE — Assessment & Plan Note (Signed)
A; epicondylitis of L elbow P: Topical NSAID Ice  Compression

## 2015-04-14 LAB — VITAMIN D 25 HYDROXY (VIT D DEFICIENCY, FRACTURES): VIT D 25 HYDROXY: 8 ng/mL — AB (ref 30–100)

## 2015-04-14 MED ORDER — VITAMIN D (ERGOCALCIFEROL) 1.25 MG (50000 UNIT) PO CAPS: 50000.0000 [IU] | ORAL_CAPSULE | ORAL | Status: DC

## 2015-04-14 MED ORDER — PRAVASTATIN SODIUM 40 MG PO TABS: 40.0000 mg | ORAL_TABLET | Freq: Every day | ORAL | Status: DC

## 2015-04-14 NOTE — Addendum Note (Signed)
Addended by: Dessa PhiFUNCHES, Letitia Sabala on: 04/14/2015 09:17 AM   Modules accepted: Orders, SmartSet

## 2015-04-19 ENCOUNTER — Ambulatory Visit: Payer: 59 | Attending: Family Medicine | Admitting: Pharmacist

## 2015-04-19 VITALS — BP 159/99 | HR 90

## 2015-04-19 DIAGNOSIS — I10 Essential (primary) hypertension: Secondary | ICD-10-CM | POA: Insufficient documentation

## 2015-04-19 DIAGNOSIS — Z79899 Other long term (current) drug therapy: Secondary | ICD-10-CM | POA: Insufficient documentation

## 2015-04-19 NOTE — Patient Instructions (Signed)
Thanks for coming to see me!  Check your blood pressure at home every day, record it, and bring it back to clinic  Come back in 2 weeks for a blood pressure check

## 2015-04-19 NOTE — Progress Notes (Signed)
S:    Patient arrives in good spirits with her daughter and granddaughter.  Presents to the clinic for hypertension evaluation.   Patient reports adherence with medications.  Current BP Medications include:  none  Antihypertensives tried in the past include: losartan and diltiazem  Patient reports that she has a blood pressure machine at home but that she has not been checking it. She often feels very nervous when she comes to the doctors office and she thinks that is why her blood pressure goes up.    O:   Last 3 Office BP readings: BP Readings from Last 3 Encounters:  04/19/15 159/99  04/13/15 136/70  01/11/14 156/69    BMET    Component Value Date/Time   NA 137 04/13/2015 1642   K 4.2 04/13/2015 1642   CL 102 04/13/2015 1642   CO2 27 04/13/2015 1642   GLUCOSE 92 04/13/2015 1642   GLUCOSE 114* 03/04/2006 1145   BUN 20 04/13/2015 1642   CREATININE 0.87 04/13/2015 1642   CREATININE 0.73 02/19/2010 2203   CALCIUM 10.2 04/13/2015 1642   GFRNONAA 72 04/13/2015 1642   GFRNONAA 70 03/04/2006 1145   GFRAA 83 04/13/2015 1642    A/P: History of hypertension currently UNcontrolled on no medications. However, there could be a component of white coat hypertension here. Patient will measure her blood pressure at home daily and then come back to clinic in 2 weeks with the readings. If they are normal at home, will contribute elevated blood pressure to white coat hypertension. If elevated, will plan to restart losartan at that time. Patient and daughter verbalized understanding.   Results reviewed and written information provided.   Total time in face-to-face counseling 20 minutes.  F/U Clinic Visit with me in 2 weeks for blood pressure check.

## 2015-04-27 ENCOUNTER — Telehealth: Payer: Self-pay | Admitting: *Deleted

## 2015-04-27 MED FILL — VIT D2 1.25 MG (50,000 UNIT: 1.25 MG | 28 days supply | Qty: 4 | Fill #0

## 2015-04-27 MED FILL — PRAVASTATIN NA 40 MG TAB: 40 | 30 days supply | Qty: 30 | Fill #0

## 2015-04-27 NOTE — Telephone Encounter (Signed)
-----   Message from Dessa PhiJosalyn Funches, MD sent at 04/14/2015  9:16 AM EST ----- Screening HIV negative Normal CMP Vit D deficiency will replaced High cholesterol, statin should be continued. Increase pravastatin to 40 mg daily

## 2015-04-27 NOTE — Telephone Encounter (Signed)
LVM to return call.

## 2015-04-27 NOTE — Telephone Encounter (Signed)
Patients daughter verified patients DOB. Daughter informed of patients HIV screening being negative. Patients CMP was normal. Patient informed of Vitamin D level being low and medication has been ordered to replace the deficiency. Patient also informed of cholesterol being high and to continue with statin and her pravastatin being increased to 40 mg daily. Patient instructed to pick up medications as soon as possible. Medications were placed on 04/14/15 with [facility]. Patient and daughter expressed their understanding and had no further questions.

## 2015-05-03 ENCOUNTER — Ambulatory Visit: Payer: BLUE CROSS/BLUE SHIELD | Attending: Family Medicine | Admitting: Pharmacist

## 2015-05-03 VITALS — BP 132/82 | HR 86

## 2015-05-03 DIAGNOSIS — I10 Essential (primary) hypertension: Secondary | ICD-10-CM

## 2015-05-03 NOTE — Patient Instructions (Signed)
Thank you for coming to see me!  Keep checking your blood pressure at home every once in a while.  If it is higher than 150/90 multiple times, then let us know  Schedule an appointment to see Dr. Armen PickupFunches for your Pap Smear

## 2015-05-03 NOTE — Progress Notes (Signed)
S:    Patient arrives in good spirits with her daughter and granddaughter.  Presents to the clinic for hypertension evaluation.   Patient reports adherence with medications.  Current BP Medications include:  none  Antihypertensives tried in the past include: losartan and diltiazem  Patient reports that she has a blood pressure machine and they have been checking her blood pressure at home. It is typically in the 130s/60s. Sometimes the bottom number is in the 70s.   Patient lost her AVS from her visit with Dr. Armen PickupFunches and is unsure of when she is supposed to come in for a pap smear and if that is here or somewhere else.   O:   Last 3 Office BP readings: BP Readings from Last 3 Encounters:  04/19/15 159/99  04/13/15 136/70  01/11/14 156/69    BMET    Component Value Date/Time   NA 137 04/13/2015 1642   K 4.2 04/13/2015 1642   CL 102 04/13/2015 1642   CO2 27 04/13/2015 1642   GLUCOSE 92 04/13/2015 1642   GLUCOSE 114* 03/04/2006 1145   BUN 20 04/13/2015 1642   CREATININE 0.87 04/13/2015 1642   CREATININE 0.73 02/19/2010 2203   CALCIUM 10.2 04/13/2015 1642   GFRNONAA 72 04/13/2015 1642   GFRNONAA 70 03/04/2006 1145   GFRAA 83 04/13/2015 1642    A/P: History of hypertension currently controlled on no medications. I do think that the patient has white coat hypertension, especially since she reports that she is very anxious when she comes to the doctor's office but at home her blood pressure has been controlled at home and today after I let her sit for a while. Will not restart any medications. Patient to report if her blood pressure is >150/90 at home consistently. Patient and daughter verbalized understanding.   Patient will make an appointment for a Pap Smear with Dr. Armen PickupFunches at check out.   Results reviewed and written information provided.   Total time in face-to-face counseling 10 minutes.  F/U Clinic Visit with Dr. Armen PickupFunches

## 2015-05-15 ENCOUNTER — Telehealth: Payer: Self-pay | Admitting: Family Medicine

## 2015-05-15 DIAGNOSIS — Z0000 Encounter for general adult medical examination without abnormal findings: Secondary | ICD-10-CM

## 2015-05-15 NOTE — Telephone Encounter (Signed)
Pt. Daughter called stating that pt. Already has an appointment for a pap smear at [facility] in February and does not need the appointment at [facility]. Pt. Daughter would want a referral for a mammogram instead. Please f/u with pt.

## 2015-05-18 NOTE — Telephone Encounter (Signed)
Cancel pap appt Screening mammogram ordered please schedule

## 2015-05-19 ENCOUNTER — Other Ambulatory Visit: Payer: Self-pay | Admitting: Family Medicine

## 2015-05-19 DIAGNOSIS — Z1231 Encounter for screening mammogram for malignant neoplasm of breast: Secondary | ICD-10-CM

## 2015-05-22 ENCOUNTER — Ambulatory Visit: Payer: No Typology Code available for payment source | Admitting: Family Medicine

## 2015-05-26 NOTE — Telephone Encounter (Signed)
Mammogram appointment schedule and information mail  Apt on Feb 15,2017 at 12:30pm

## 2015-05-29 MED FILL — VIT D2 1.25 MG (50,000 UNIT: 1.25 MG | 28 days supply | Qty: 4 | Fill #1

## 2015-05-29 MED FILL — PRAVASTATIN NA 40 MG TAB: 40 | 30 days supply | Qty: 30 | Fill #1

## 2015-06-07 ENCOUNTER — Ambulatory Visit: Payer: BLUE CROSS/BLUE SHIELD

## 2015-06-26 ENCOUNTER — Ambulatory Visit
Admission: RE | Admit: 2015-06-26 | Discharge: 2015-06-26 | Disposition: A | Discharge status: Home / Self Care | Payer: BLUE CROSS/BLUE SHIELD | Source: Ambulatory Visit | Attending: Family Medicine | Admitting: Family Medicine

## 2015-06-26 DIAGNOSIS — Z1231 Encounter for screening mammogram for malignant neoplasm of breast: Secondary | ICD-10-CM

## 2015-06-27 MED FILL — PRAVASTATIN NA 40 MG TAB: 40 | 30 days supply | Qty: 30 | Fill #2

## 2015-08-01 MED FILL — PRAVASTATIN NA 40 MG TAB: 40 | 30 days supply | Qty: 30 | Fill #3

## 2015-09-04 MED FILL — PRAVASTATIN NA 40 MG TAB: 40 | 30 days supply | Qty: 30 | Fill #4

## 2016-12-29 IMAGING — MG MM SCREEN MAMMOGRAM BILATERAL
4 series · 4 of 4 positions shown · non-contrast
Comparison: Previous exam(s).

CLINICAL DATA: Screening.

EXAM:
DIGITAL SCREENING BILATERAL MAMMOGRAM WITH CAD

[R CC]
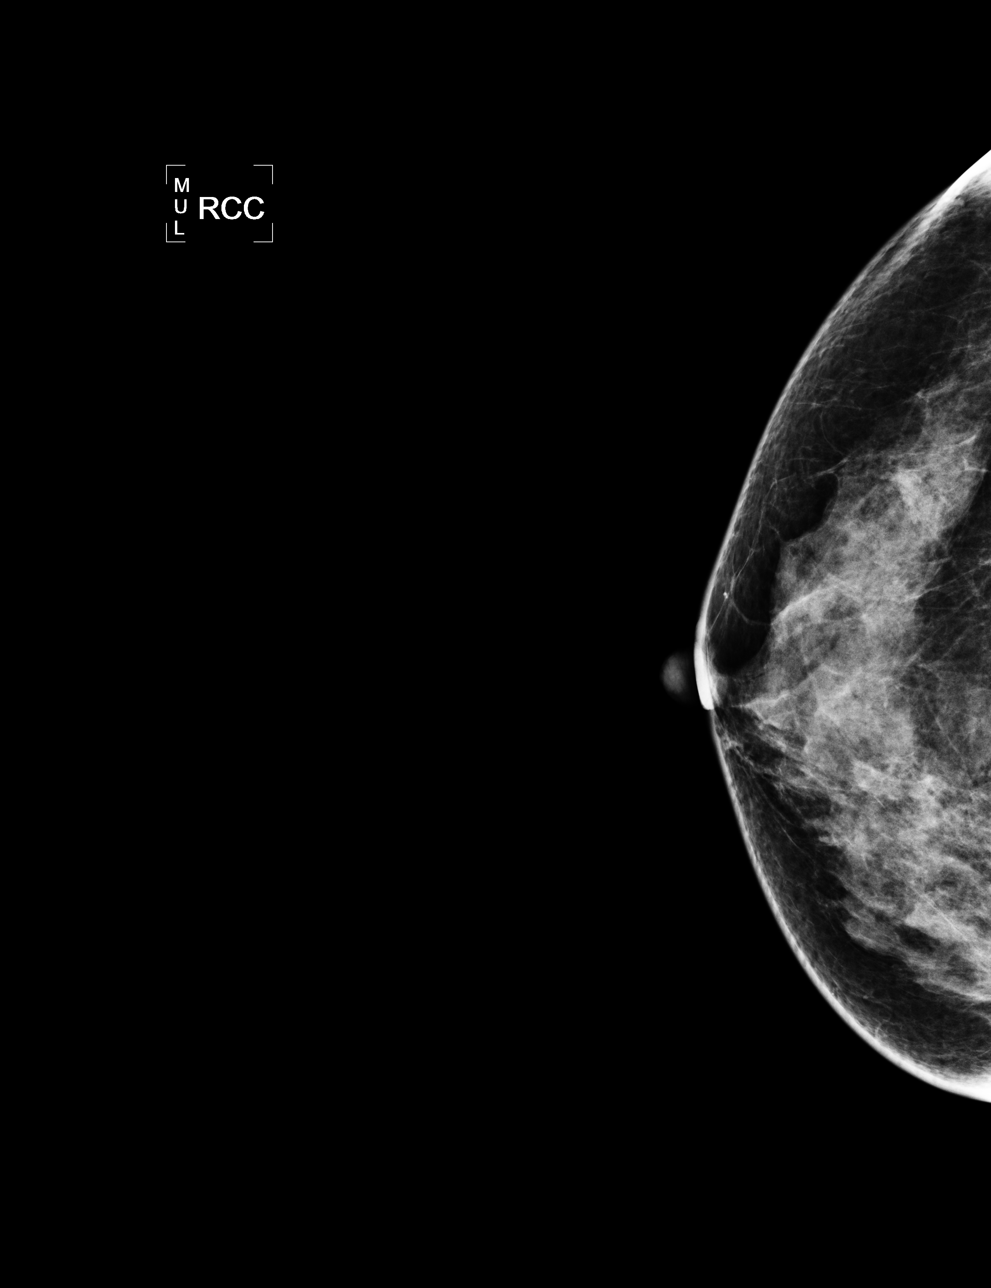

[L CC]
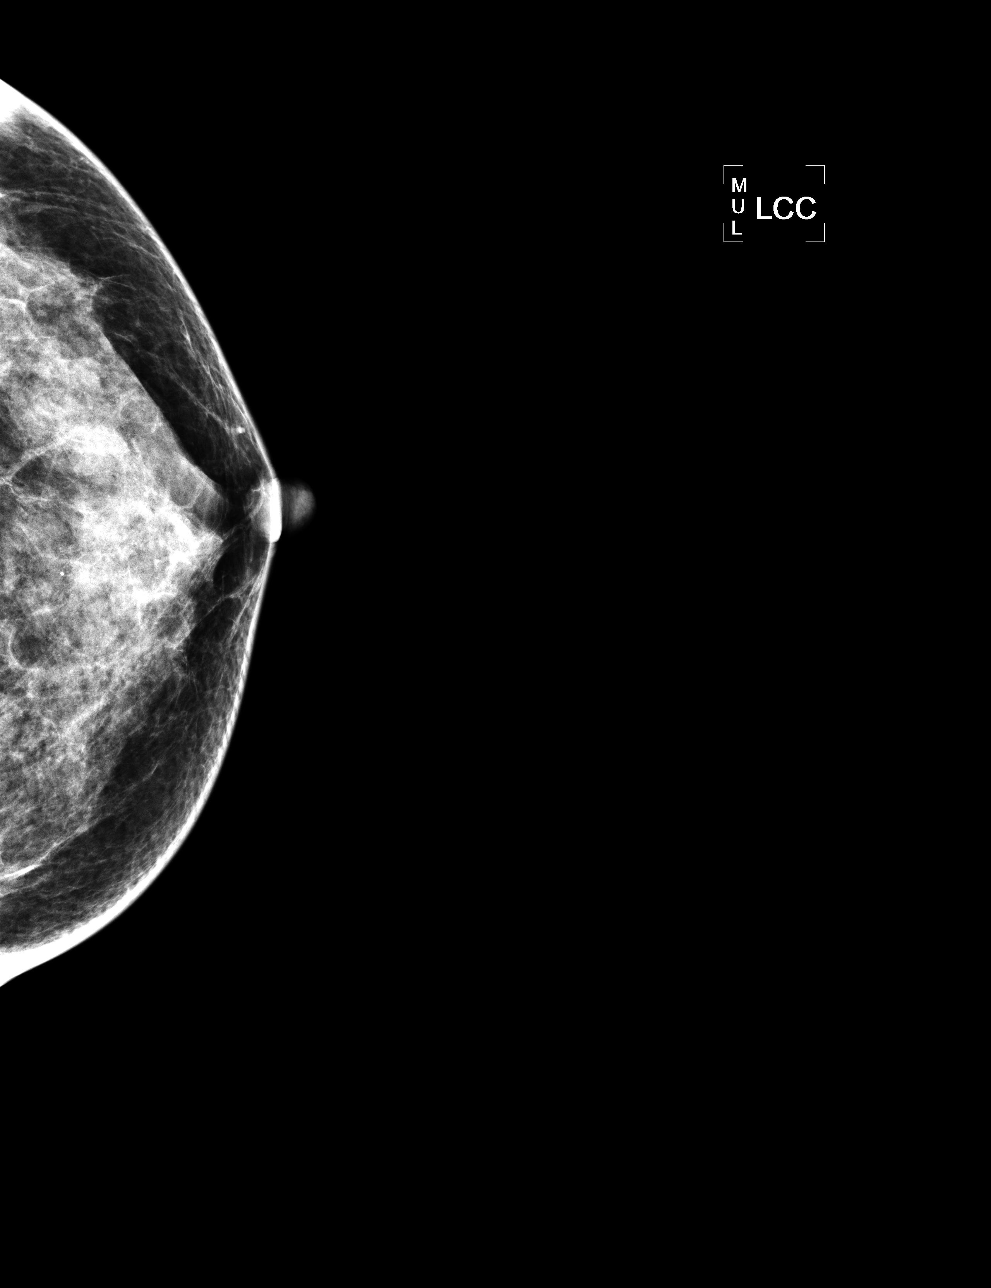

[L MLO]
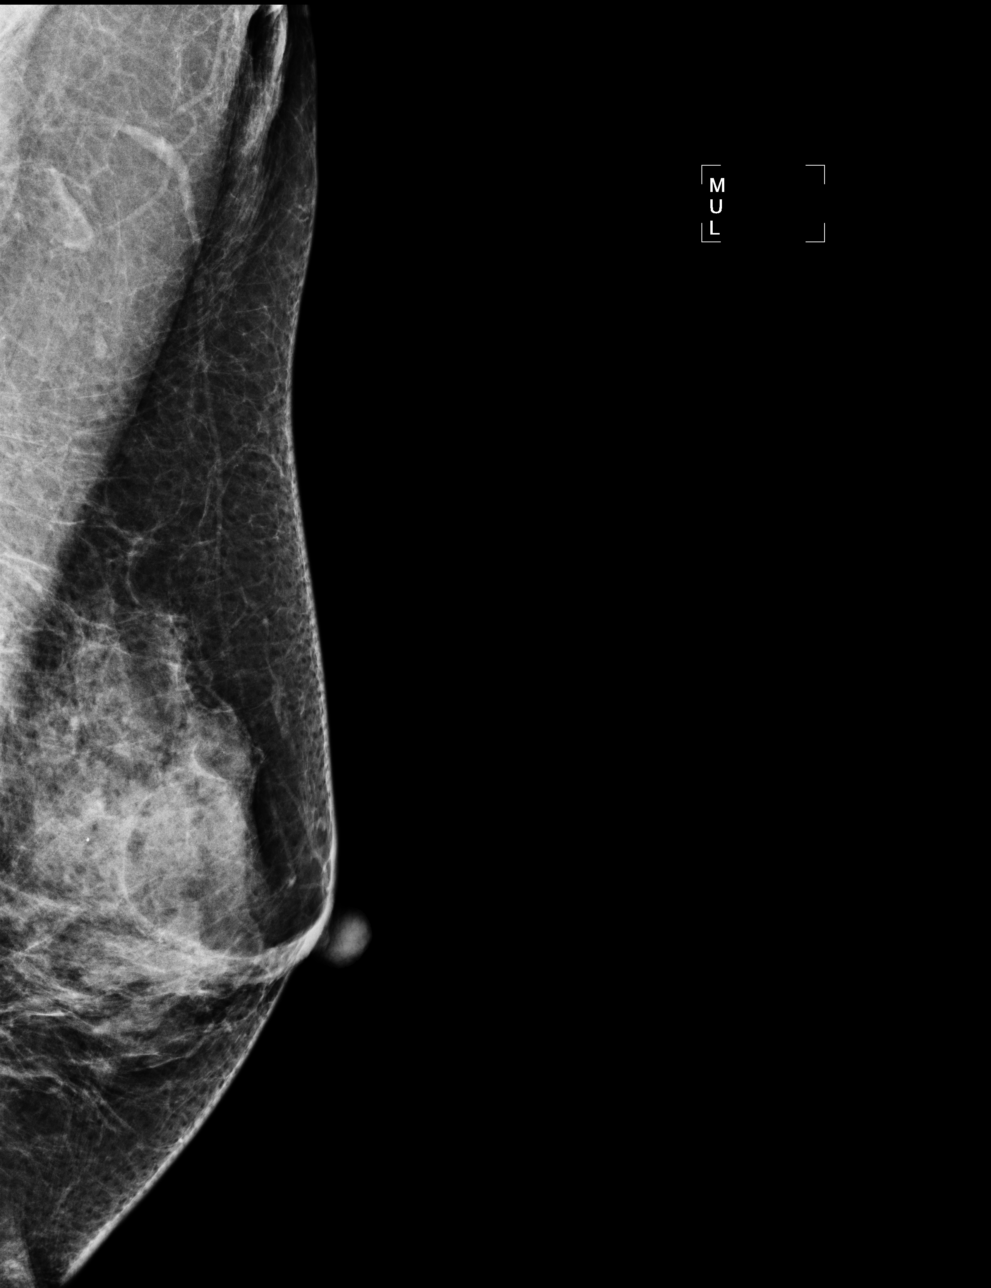

[R MLO]
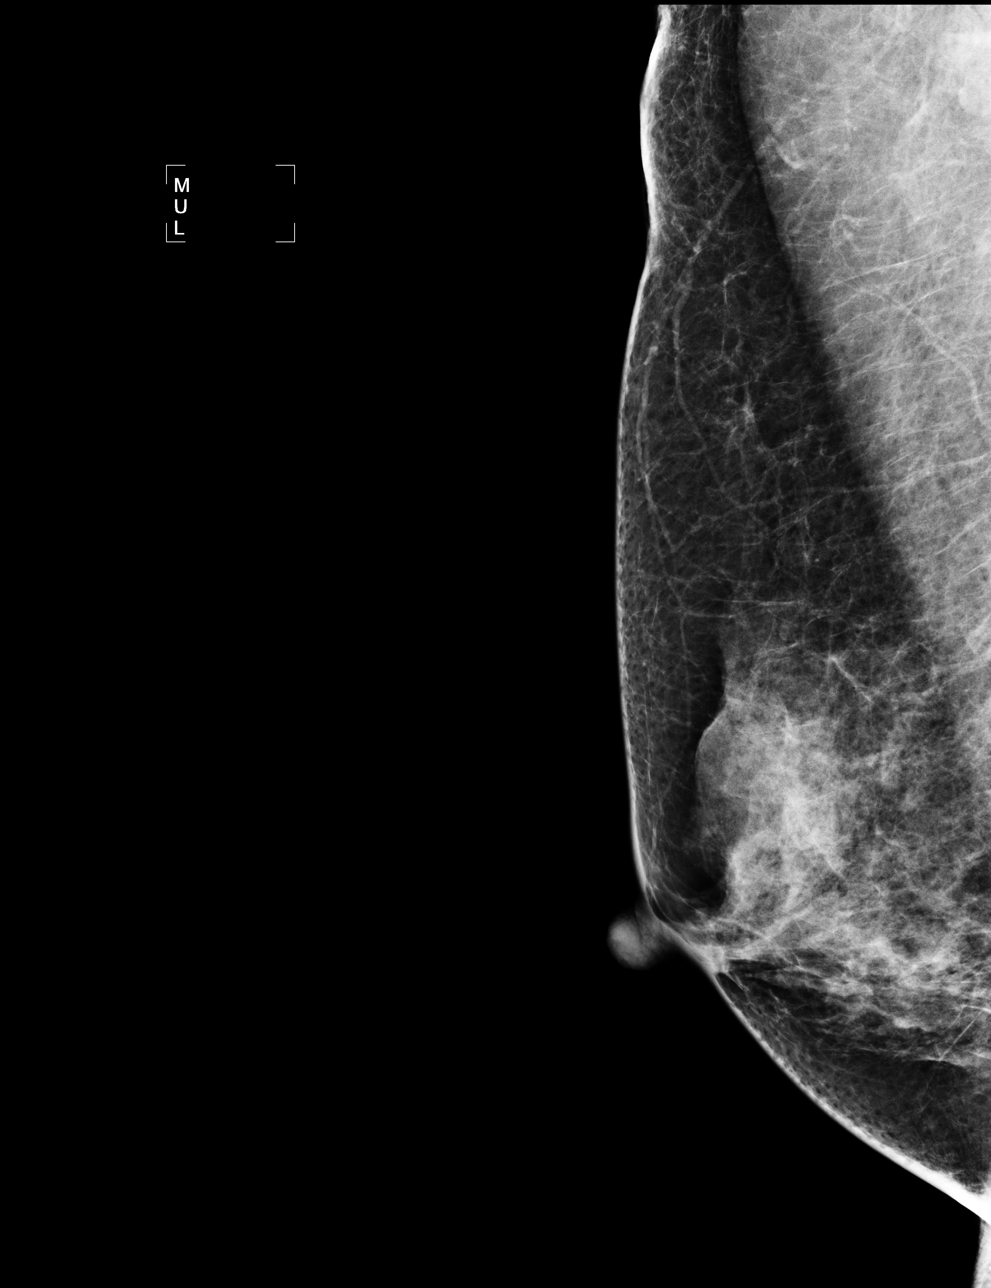

[4 of 4 positions shown; findings below may reference images not displayed]

ACR Breast Density Category d: The breast tissue is extremely dense,
which lowers the sensitivity of mammography.
FINDINGS: There are no findings suspicious for malignancy. Images were
processed with CAD.
IMPRESSION: No mammographic evidence of malignancy. A result letter of this
screening mammogram will be mailed directly to the patient.

RECOMMENDATION:
Screening mammogram in one year. (Code:[id number])

BI-RADS CATEGORY  1: Negative.

## 2017-07-14 ENCOUNTER — Ambulatory Visit: Payer: BLUE CROSS/BLUE SHIELD | Attending: Nurse Practitioner | Admitting: Nurse Practitioner

## 2017-07-14 ENCOUNTER — Encounter: Payer: Self-pay | Admitting: Nurse Practitioner

## 2017-07-14 VITALS — BP 136/87 | HR 90 | Temp 98.3°F | Resp 12 | Ht 62.0 in | Wt 122.4 lb

## 2017-07-14 DIAGNOSIS — E782 Mixed hyperlipidemia: Secondary | ICD-10-CM

## 2017-07-14 DIAGNOSIS — Z79899 Other long term (current) drug therapy: Secondary | ICD-10-CM | POA: Diagnosis not present

## 2017-07-14 DIAGNOSIS — Z1231 Encounter for screening mammogram for malignant neoplasm of breast: Secondary | ICD-10-CM

## 2017-07-14 DIAGNOSIS — Z760 Encounter for issue of repeat prescription: Secondary | ICD-10-CM | POA: Diagnosis not present

## 2017-07-14 DIAGNOSIS — R0989 Other specified symptoms and signs involving the circulatory and respiratory systems: Secondary | ICD-10-CM

## 2017-07-14 DIAGNOSIS — I10 Essential (primary) hypertension: Secondary | ICD-10-CM

## 2017-07-14 DIAGNOSIS — E785 Hyperlipidemia, unspecified: Secondary | ICD-10-CM | POA: Insufficient documentation

## 2017-07-14 MED ORDER — AMLODIPINE BESYLATE 5 MG PO TABS: 5.0000 mg | ORAL_TABLET | Freq: Every day | ORAL | 3 refills | Status: DC

## 2017-07-14 MED ORDER — FLUTICASONE PROPIONATE 50 MCG/ACT NA SUSP: 2.0000 | Freq: Every day | NASAL | 6 refills | Status: AC

## 2017-07-14 MED ORDER — CLONIDINE HCL 0.1 MG PO TABS
0.2000 mg | ORAL_TABLET | Freq: Once | ORAL | Status: AC
  Administered 2017-07-14: 0.2 mg via ORAL

## 2017-07-14 MED ORDER — GUAIFENESIN 100 MG/5ML PO SYRP: 200.0000 mg | ORAL_SOLUTION | Freq: Three times a day (TID) | ORAL | 0 refills | Status: DC | PRN

## 2017-07-14 MED ORDER — PRAVASTATIN SODIUM 40 MG PO TABS: 40.0000 mg | ORAL_TABLET | Freq: Every day | ORAL | 5 refills | Status: DC

## 2017-07-14 MED FILL — AMLODIPINE BESYLATE 5 MG TA: 5 | 90 days supply | Qty: 90 | Fill #0

## 2017-07-14 MED FILL — PRAVASTATIN NA 40 MG TAB: 40 | 30 days supply | Qty: 30 | Fill #0

## 2017-07-14 MED FILL — FLUTICASONE PROP 50 MCG SPR: 50 | 30 days supply | Qty: 16 | Fill #0

## 2017-07-14 NOTE — Progress Notes (Signed)
Assessment & Plan:  Shannon Robinson was seen today for establish care, leg pain and medication refill.  Diagnoses and all orders for this visit:  Essential hypertension, benign -     cloNIDine (CATAPRES) tablet 0.2 mg -     amLODipine (NORVASC) 5 MG tablet; Take 1 tablet (5 mg total) by mouth daily. -     Basic metabolic panel -     CBC Continue all antihypertensives as prescribed.  Remember to bring in your blood pressure log with you for your follow up appointment.  DASH/Mediterranean Diets are healthier choices for HTN.   Hyperlipidemia -     pravastatin (PRAVACHOL) 40 MG tablet; Take 1 tablet (40 mg total) by mouth daily. -     Lipid panel Work on a low fat, heart healthy diet and participate in regular aerobic exercise program to control as well by working out at least 150 minutes per week. No fried foods. No junk foods, sodas, sugary drinks, unhealthy snacking, or smoking.   Breast cancer screening by mammogram -     MM SCREENING BREAST TOMO BILATERAL; Future  Upper respiratory symptom -     fluticasone (FLONASE) 50 MCG/ACT nasal spray; Place 2 sprays into both nostrils daily. -     guaifenesin (ROBITUSSIN) 100 MG/5ML syrup; Take 10 mLs (200 mg total) by mouth 3 (three) times daily as needed for cough.   Patient has been counseled on age-appropriate routine health concerns for screening and prevention. These are reviewed and up-to-date. Referrals have been placed accordingly. Immunizations are up-to-date or declined.    Subjective:   Chief Complaint  Patient presents with  . Establish Care    Pt is here to establish care for check-up.   . Leg Pain    Pt stated her left side body hurts for 2 weeks. Have not fall recently, currently have a cold.   . Medication Refill    Pt. need Pravastatin refill.    HPI Shannon Robinson 67 y.o. female presents to office today to establish care. Her daughter is here translating for her today. She has not been seen by a PCP in well over a year.    Essential Hypertension Chronic. She has been on an antihypertensive in the past but can not recall the name. She states they took her off the medication but can not recall the reason. Her blood pressure is poorly controlled today however decreased after being given clonidine 0.2mg .  Denies chest pain, shortness of breath, palpitations, lightheadedness, dizziness, headaches or BLE edema.  BP Readings from Last 3 Encounters:  07/14/17 136/87  05/03/15 132/82  04/19/15 (!) 159/99    URI symptoms She has been experiencing body aches, chest congestion, rhinorrhea. Onset of symptoms for 2 weeks. She has taken Nyquil OTC for symptoms with minimal relief. Reports her spouse has also been sick with cold symptoms.  Hyperlipidemia Needs fasting lipid panel. Endorses medication compliance taking pravastatin  daily. Denies statin intolerance or myalgias.  Lab Results  Component Value Date   LDLCALC 124 04/13/2015   Review of Systems  Constitutional: Positive for malaise/fatigue. Negative for fever and weight loss.  HENT: Positive for congestion. Negative for nosebleeds.        Rhinorrhea  Eyes: Negative.  Negative for blurred vision, double vision and photophobia.  Respiratory: Negative.  Negative for cough and shortness of breath.   Cardiovascular: Negative.  Negative for chest pain, palpitations and leg swelling.  Gastrointestinal: Negative.  Negative for heartburn, nausea and vomiting.  Musculoskeletal:  Positive for myalgias.  Neurological: Negative.  Negative for dizziness, focal weakness, seizures and headaches.  Psychiatric/Behavioral: Negative.  Negative for suicidal ideas.    Past Medical History:  Diagnosis Date  . Hypertension     History reviewed. No pertinent surgical history.  Family History  Problem Relation Age of Onset  . Diabetes Mother   . Diabetes Father     Social History Reviewed with no changes to be made today.   Outpatient Medications Prior to Visit   Medication Sig Dispense Refill  . pravastatin (PRAVACHOL) 40 MG tablet Take 1 tablet (40 mg total) by mouth daily. 30 tablet 5  . Vitamin D, Ergocalciferol, (DRISDOL) 50000 UNITS CAPS capsule Take 1 capsule (50,000 Units total) by mouth every 7 (seven) days. For 12 weeks (Patient not taking: Reported on 07/14/2017) 12 capsule 0  . zoster vaccine live, PF, (ZOSTAVAX) 16109 UNT/0.65ML injection Inject 19,400 Units into the skin once. (Patient not taking: Reported on 07/14/2017) 1 each 0  . diclofenac sodium (VOLTAREN) 1 % GEL Apply 2 g topically 4 (four) times daily. (Patient not taking: Reported on 07/14/2017) 100 g 0   No facility-administered medications prior to visit.     No Known Allergies     Objective:    BP 136/87 (BP Location: Left Arm, Cuff Size: Normal)   Pulse 90   Temp 98.3 F (36.8 C) (Oral)   Resp 12   Ht  (1.575 m)   Wt 122 lb 6.4 oz (55.5 kg)   SpO2 97%   BMI 22.39 kg/m  Wt Readings from Last 3 Encounters:  07/14/17 122 lb 6.4 oz (55.5 kg)  04/13/15 119 lb (54 kg)  12/14/13 112 lb 9.6 oz (51.1 kg)    Physical Exam  Constitutional: She is oriented to person, place, and time. She appears well-developed and well-nourished. She is cooperative.  HENT:  Head: Normocephalic and atraumatic.  Eyes: EOM are normal.  Neck: Normal range of motion.  Cardiovascular: Normal rate, regular rhythm, normal heart sounds and intact distal pulses. Exam reveals no gallop and no friction rub.  No murmur heard. Pulmonary/Chest: Effort normal and breath sounds normal. No tachypnea. No respiratory distress. She has no decreased breath sounds. She has no wheezes. She has no rhonchi. She has no rales. She exhibits no tenderness.  Abdominal: Soft. Bowel sounds are normal.  Musculoskeletal: Normal range of motion. She exhibits no edema.  Neurological: She is alert and oriented to person, place, and time. Coordination normal.  Skin: Skin is warm and dry.  Psychiatric: She has a normal  mood and affect. Her behavior is normal. Judgment and thought content normal.  Nursing note and vitals reviewed.      Patient has been counseled extensively about nutrition and exercise as well as the importance of adherence with medications and regular follow-up. The patient was given clear instructions to go to ER or return to medical center if symptoms don't improve, worsen or new problems develop. The patient verbalized understanding.   Follow-up: Return in about 3 months (around 10/14/2017) for BP recheck.   Claiborne Rigg, FNP-BC [facility] and [facility] [place], [place] [phone]   07/15/2017, 12:17 AM

## 2017-07-15 ENCOUNTER — Encounter: Payer: Self-pay | Admitting: Nurse Practitioner

## 2017-07-15 LAB — BASIC METABOLIC PANEL
BUN / CREAT RATIO: 22 (ref 12–28)
BUN: 15 mg/dL (ref 8–27)
CHLORIDE: 104 mmol/L (ref 96–106)
CO2: 22 mmol/L (ref 20–29)
Calcium: 9.2 mg/dL (ref 8.7–10.3)
Creatinine, Ser: 0.69 mg/dL (ref 0.57–1.00)
GFR, EST AFRICAN AMERICAN: 106 mL/min/{1.73_m2} (ref >59)
GFR, EST NON AFRICAN AMERICAN: 92 mL/min/{1.73_m2} (ref >59)
Glucose: 113 mg/dL — ABNORMAL HIGH (ref 65–99)
POTASSIUM: 4.2 mmol/L (ref 3.5–5.2)
SODIUM: 141 mmol/L (ref 134–144)

## 2017-07-15 LAB — LIPID PANEL
Chol/HDL Ratio: 4.8 ratio — ABNORMAL HIGH (ref 0.0–4.4)
Cholesterol, Total: 206 mg/dL — ABNORMAL HIGH (ref 100–199)
HDL: 43 mg/dL (ref >39)
LDL Calculated: 106 mg/dL — ABNORMAL HIGH (ref 0–99)
Triglycerides: 283 mg/dL — ABNORMAL HIGH (ref 0–149)
VLDL Cholesterol Cal: 57 mg/dL — ABNORMAL HIGH (ref 5–40)

## 2017-07-15 LAB — CBC
Hematocrit: 36.4 % (ref 34.0–46.6)
Hemoglobin: 12.3 g/dL (ref 11.1–15.9)
MCH: 30.3 pg (ref 26.6–33.0)
MCHC: 33.8 g/dL (ref 31.5–35.7)
MCV: 90 fL (ref 79–97)
Platelets: 300 10*3/uL (ref 150–379)
RBC: 4.06 x10E6/uL (ref 3.77–5.28)
RDW: 14.2 % (ref 12.3–15.4)
WBC: 10.6 10*3/uL (ref 3.4–10.8)

## 2017-07-22 ENCOUNTER — Telehealth: Payer: Self-pay

## 2017-07-22 NOTE — Telephone Encounter (Signed)
-----   Message from Claiborne RiggZelda W Fleming, NP sent at 07/20/2017  7:55 PM EDT ----- Please call patient: Tests show increased cholesterol/lipid levels. Make sure you are taking your cholesterol medicine every day as prescribed and not missing doses.    INSTRUCTIONS: Work on a low fat, heart healthy diet and participate in regular aerobic exercise program by working out at least 150 minutes per week. No fried foods. No junk foods, sodas, sugary drinks, unhealthy snacking, alcohol or smoking.

## 2017-07-22 NOTE — Telephone Encounter (Signed)
CMA spoke to patient's daughter to inform on lab results.  Patient's daughter will relay the message to patient.   Patient's daughter said PCP was going to give patient cream for her thigh.

## 2017-07-23 NOTE — Telephone Encounter (Signed)
Noted  

## 2017-08-06 ENCOUNTER — Ambulatory Visit
Admission: RE | Admit: 2017-08-06 | Discharge: 2017-08-06 | Disposition: A | Discharge status: Home / Self Care | Payer: BLUE CROSS/BLUE SHIELD | Source: Ambulatory Visit | Attending: Nurse Practitioner | Admitting: Nurse Practitioner

## 2017-08-06 DIAGNOSIS — Z1231 Encounter for screening mammogram for malignant neoplasm of breast: Secondary | ICD-10-CM

## 2017-08-20 ENCOUNTER — Ambulatory Visit: Payer: BLUE CROSS/BLUE SHIELD | Attending: Nurse Practitioner | Admitting: Nurse Practitioner

## 2017-08-20 ENCOUNTER — Encounter: Payer: Self-pay | Admitting: Nurse Practitioner

## 2017-08-20 VITALS — BP 162/80 | HR 82 | Temp 98.5°F | Resp 14 | Ht 62.0 in | Wt 119.2 lb

## 2017-08-20 DIAGNOSIS — R05 Cough: Secondary | ICD-10-CM | POA: Diagnosis not present

## 2017-08-20 DIAGNOSIS — Z79899 Other long term (current) drug therapy: Secondary | ICD-10-CM | POA: Insufficient documentation

## 2017-08-20 DIAGNOSIS — M25552 Pain in left hip: Secondary | ICD-10-CM | POA: Diagnosis not present

## 2017-08-20 DIAGNOSIS — I10 Essential (primary) hypertension: Secondary | ICD-10-CM | POA: Diagnosis not present

## 2017-08-20 DIAGNOSIS — Z833 Family history of diabetes mellitus: Secondary | ICD-10-CM | POA: Diagnosis not present

## 2017-08-20 DIAGNOSIS — R0989 Other specified symptoms and signs involving the circulatory and respiratory systems: Secondary | ICD-10-CM

## 2017-08-20 MED ORDER — GUAIFENESIN 100 MG/5ML PO SYRP: 200.0000 mg | ORAL_SOLUTION | Freq: Three times a day (TID) | ORAL | 0 refills | Status: DC | PRN

## 2017-08-20 MED ORDER — DICLOFENAC SODIUM 1 % TD GEL: 2.0000 g | Freq: Four times a day (QID) | TRANSDERMAL | 1 refills | Status: AC

## 2017-08-20 MED ORDER — AMLODIPINE BESYLATE 10 MG PO TABS: 10.0000 mg | ORAL_TABLET | Freq: Every day | ORAL | 0 refills | Status: DC

## 2017-08-20 MED ORDER — BENZONATATE 100 MG PO CAPS
100.0000 mg | ORAL_CAPSULE | Freq: Two times a day (BID) | ORAL | 0 refills | Status: DC | PRN
Start: 2017-08-20 — End: 2017-08-20

## 2017-08-20 MED FILL — BENZONATATE 100 MG CAP: 100 | 10 days supply | Qty: 20 | Fill #0

## 2017-08-20 NOTE — Progress Notes (Signed)
Assessment & Plan:  Shannon Robinson was seen today for blood pressure check, medication problem and cough.  Diagnoses and all orders for this visit:  Essential hypertension, benign -     amLODipine (NORVASC) 10 MG tablet; Take 1 tablet (10 mg total) by mouth daily. INCREASED FROM  to 10 MG today.   Upper respiratory symptom -     guaifenesin (ROBITUSSIN) 100 MG/5ML syrup; Take 10 mLs (200 mg total) by mouth 3 (three) times daily as needed for cough.  Left hip pain -     diclofenac sodium (VOLTAREN) 1 % GEL; Apply 2 g topically 4 (four) times daily. May alternate with heat and ice application for pain relief. May also alternate with acetaminophen  as prescribed for relief of pain. Other alternatives include massage, acupuncture and water aerobics.  You must stay active and avoid a sedentary lifestyle.       Patient has been counseled on age-appropriate routine health concerns for screening and prevention. These are reviewed and up-to-date. Referrals have been placed accordingly. Immunizations are up-to-date or declined.    Subjective:   Chief Complaint  Patient presents with  . Blood Pressure Check  . Medication Problem    Pt. stated that PCP was supposed to give her pain medicine for her left leg pain.    Marland Kitchen Cough    Pt. stated she still have the cough. She takes Nyquil OTC but her cough came back.    HPI Shannon Robinson 67 y.o. female presents to office today for BP recheck. She is accompanied by her daughter who is translating for her today.    Essential Hypertension Chronic.She endorses medication compliance. Her daughter has been checking her blood pressure at home and endorses higher than normal readings. Patient is diet compliant.  Denies chest pain, shortness of breath, palpitations, lightheadedness, dizziness, headaches or BLE edema.  BP Readings from Last 3 Encounters:  08/20/17 (!) 162/80  07/14/17 136/87  05/03/15 132/82   Upper Respiratory Illness Patient complains of  symptoms of a URI. Symptoms include productive cough, chest congestion and rhinorrhea. Onset of symptoms was 1 month ago, unchanged since that time. She is drinking plenty of fluids. Evaluation to date: none seen previously and thought to have a viral URI. Treatment to date: oral cough syrup which she did not pick up.  Left Leg Pain Endorses left thigh, knee and calf pain. Pain is described as aching and similar to previous arthritic pain she was experienced in the past in multiple areas of her body including left elbow. She denies any injury or trauma.    Review of Systems  Constitutional: Negative for fever, malaise/fatigue and weight loss.  HENT: Positive for congestion. Negative for nosebleeds.   Eyes: Negative.  Negative for blurred vision, double vision and photophobia.  Respiratory: Positive for cough. Negative for shortness of breath.   Cardiovascular: Negative.  Negative for chest pain, palpitations and leg swelling.  Gastrointestinal: Negative.  Negative for heartburn, nausea and vomiting.  Musculoskeletal: Positive for myalgias. Negative for back pain and falls.  Neurological: Negative.  Negative for dizziness, focal weakness, seizures and headaches.  Psychiatric/Behavioral: Negative.  Negative for suicidal ideas.    Past Medical History:  Diagnosis Date  . Hypertension     History reviewed. No pertinent surgical history.  Family History  Problem Relation Age of Onset  . Diabetes Mother   . Diabetes Father     Social History Reviewed with no changes to be made today.   Outpatient Medications  Prior to Visit  Medication Sig Dispense Refill  . fluticasone (FLONASE) 50 MCG/ACT nasal spray Place 2 sprays into both nostrils daily. 16 g 6  . pravastatin (PRAVACHOL) 40 MG tablet Take 1 tablet (40 mg total) by mouth daily. 30 tablet 5  . amLODipine (NORVASC) 5 MG tablet Take 1 tablet (5 mg total) by mouth daily. 90 tablet 3  . zoster vaccine live, PF, (ZOSTAVAX) 45409  UNT/0.65ML injection Inject 19,400 Units into the skin once. (Patient not taking: Reported on 07/14/2017) 1 each 0  . guaifenesin (ROBITUSSIN) 100 MG/5ML syrup Take 10 mLs (200 mg total) by mouth 3 (three) times daily as needed for cough. (Patient not taking: Reported on 08/20/2017) 120 mL 0  . Vitamin D, Ergocalciferol, (DRISDOL) 50000 UNITS CAPS capsule Take 1 capsule (50,000 Units total) by mouth every 7 (seven) days. For 12 weeks (Patient not taking: Reported on 07/14/2017) 12 capsule 0   No facility-administered medications prior to visit.     No Known Allergies     Objective:    BP (!) 162/80 (BP Location: Right Arm, Patient Position: Sitting, Cuff Size: Normal)   Pulse 82   Temp 98.5 F (36.9 C) (Oral)   Resp 14   Ht  (1.575 m)   Wt 119 lb 3.2 oz (54.1 kg)   SpO2 97%   BMI 21.80 kg/m  Wt Readings from Last 3 Encounters:  08/20/17 119 lb 3.2 oz (54.1 kg)  07/14/17 122 lb 6.4 oz (55.5 kg)  04/13/15 119 lb (54 kg)    Physical Exam  Constitutional: She is oriented to person, place, and time. She appears well-developed and well-nourished. She is cooperative.  HENT:  Head: Normocephalic and atraumatic.  Eyes: EOM are normal.  Neck: Normal range of motion.  Cardiovascular: Normal rate, regular rhythm and normal heart sounds. Exam reveals no gallop and no friction rub.  No murmur heard. Pulmonary/Chest: Effort normal. No tachypnea. No respiratory distress. She has no decreased breath sounds. She has no wheezes. She has rhonchi (clears with cough). She has no rales. She exhibits no tenderness.  Abdominal: Soft. Bowel sounds are normal.  Musculoskeletal: Normal range of motion. She exhibits no edema or deformity.       Left hip: She exhibits no swelling.       Left knee: She exhibits normal range of motion and no swelling.       Left ankle: She exhibits normal range of motion and no swelling.  Neurological: She is alert and oriented to person, place, and time. Coordination  normal.  Skin: Skin is warm and dry.  Psychiatric: She has a normal mood and affect. Her behavior is normal. Judgment and thought content normal.  Nursing note and vitals reviewed.      Patient has been counseled extensively about nutrition and exercise as well as the importance of adherence with medications and regular follow-up. The patient was given clear instructions to go to ER or return to medical center if symptoms don't improve, worsen or new problems develop. The patient verbalized understanding.   Follow-up: Return in about 3 weeks (around 09/10/2017) for BP recheck; nurse visit and 3 months f/u for HTN.   Claiborne Rigg, FNP-BC [facility] [place], [place] [phone]   08/20/2017, 1:19 PM

## 2017-08-20 NOTE — Patient Instructions (Signed)
Phng ng?a b?nh tim Heart Disease Prevention B?nh tim l nguyn nhn hng ??u gy t? vong. C nhi?u vi?c qu v? c th? lm ?? gip phng ng?a b?nh tim. Duy tr ho?t ??ng th? ch?t Ho?t ??ng th? ch?t t?t cho tim qu v?. N gip ki?m sot huy?t p, n?ng ?? cholesterol v cn n?ng c?a qu v?. Hy c? g?ng ho?t ??ng th? ch?t m?i ngy. Hy h?i chuyn gia ch?m [place] s?c kh?e xem nh?ng ho?t ??ng no t?t nh?t cho qu v?. C cn n?ng kh?e m?nh Th?a cn c th? gy s?c p ln tim v ?nh h??ng ??n huy?t p v n?ng ?? cholesterol c?a qu v?. Hy gi?m cn b?ng ch? ?? ?n v t?p luy?n n?u chuyn gia ch?m [place] s?c kh?e c?a qu v? khuy?n ngh?. ?n cc th?c ph?m t?t cho tim [place] th? m?t k? ho?ch ?n u?ng lnh m?nh theo khuy?n ngh? c?a chuyn gia ch?m [place] s?c kh?e. Cc th?c ph?m t?t cho tim bao g?m:  Th?c ph?m nhi?u ch?t x?. Nh?ng th?c ph?m ny bao g?m cm y?n m?ch, b?t y?n m?ch v bnh m? v b?t ng? c?c t? ng? c?c nguyn cm.  Tri cy v rau c?.  Trnh:  R??u.  Th?c ph?m chin.  Cc th?c ph?m nhi?u ch?t bo bo ha. Nh?ng th?c ph?m ny bao g?m th?t, b?, cc s?n ph?m s?a nguyn kem, m? pha bnh v d?u d?a ho?c d?u c?.  Cc th?c ph?m m?n. Nh?ng th?c ph?m ny bao g?m ?? h?p, th?t ?n tr?a, ?? ?n nh? m?n v ?? ?n nhanh.  Gi? cho n?ng ?? cholesterol c?a qu v? trong t?m ki?m sot Cholesterol l ch?t ???c s? d?ng cho nhi?u ch?c n?ng quan tr?ng. Khi n?ng ?? cholesterol c?a qu v? cao, cholesterol c th? bm vo thnh trong m?ch mu, khi?n m?ch mu b? h?p l?i ho?c t?c ngh?n. Tnh tr?ng ny c th? d?n ??n ?au ng?c (?au th?t ng?c) v nh?i mu c? tim. Gi? n?ng ?? cholesterol c?a qu v? trong t?m ki?m sot theo khuy?n ngh? c?a chuyn gia ch?m [place] s?c kh?e. Ki?m tra n?ng ?? cholesterol c?a qu v? t nh?t m?t l?n m?i n?m. N?ng ?? cholesterol m?c tiu (theo mg/dL) cho h?u h?t m?i ng??i l:  Cholesterol ton ph?n d??i 200.  Cholesterol LDL d??i 100.  Cholesterol HDL trn 40 ? nam gi?i v trn 50 ? n? gi?i.  Triglycerides  d??i 150.  Gi? cho huy?t p c?a qu v? trong t?m ki?m sot [place] huy?t p (t?ng huy?t p) khi?n qu v? c nguy c? b? ??t qu? v cc d?ng b?nh tim khc. Gi? huy?t p c?a qu v? trong t?m ki?m sot theo khuy?n ngh? c?a chuyn gia ch?m [place] s?c kh?e. H?i chuyn gia ch?m [place] s?c kh?e xem qu v? c c?n ?i?u tr? ?? h? huy?t p hay khng. N?u qu v? trong ?? tu?i t? 18-39 th hy ki?m tra huy?t p 3-5 n?m m?t l?n. N?u qu v? t? 40 tu?i tr? ln th hy ki?m tra huy?t p m?i n?m m?t l?n. Khng s? d?ng cc s?n ph?m thu?c l Khi thu?c l c th? gy t?n th??ng cho tim v m?ch mu c?a qu v?. Khng s? d?ng b?t c? cc s?n ph?m thu?c l no, bao g?m thu?c l d?ng ht, thu?c l d?ng nhai ho?c thu?c l ?i?n t?. N?u qu v? c?n gip ?? ?? cai thu?c, hy h?i chuyn gia ch?m  s?c kh?e. Dng thu?c theo ch? d?n Ch? s?  d?ng thu?c theo ch? d?n c?a chuyn gia ch?m [place] s?c kh?e. Hy h?i chuyn gia ch?m [place] s?c kh?e xem qu v? c c?n dng aspirin m?i ngy khng. Dng aspirin c th? gip gi?m nguy c? b? b?nh tim v ??t qu? c?a qu v?. N?i ?? tm thm thng tin: ?? tm hi?u thm v? b?nh tim, hy gh trang web c?a H?i Tim [employer] K? t?i www.americanheart.org Thng tin ny khng nh?m m?c ?ch thay th? cho l?i khuyn m chuyn gia ch?m [place] s?c kh?e ni v?i qu v?. Hy b?o ??m qu v? ph?i th?o lu?n b?t k? v?n ?? g m qu v? c v?i chuyn gia ch?m [place] s?c kh?e c?a qu v?. Document Released: 07/22/2016 Document Revised: 07/22/2016 Elsevier Interactive Patient Education  [employer].

## 2017-08-28 MED FILL — PRAVASTATIN NA 40 MG TAB: 40 | 30 days supply | Qty: 30 | Fill #1

## 2017-09-16 ENCOUNTER — Ambulatory Visit: Payer: BLUE CROSS/BLUE SHIELD | Attending: Nurse Practitioner | Admitting: *Deleted

## 2017-09-16 VITALS — BP 150/80 | HR 77 | Resp 16

## 2017-09-16 DIAGNOSIS — I10 Essential (primary) hypertension: Secondary | ICD-10-CM

## 2017-09-16 DIAGNOSIS — Z79899 Other long term (current) drug therapy: Secondary | ICD-10-CM | POA: Diagnosis not present

## 2017-09-16 MED ORDER — LOSARTAN POTASSIUM 50 MG PO TABS
50.0000 mg | ORAL_TABLET | Freq: Every day | ORAL | 3 refills | Status: AC
Start: 2017-09-16 — End: ?

## 2017-09-16 MED FILL — LOSARTAN POTASSIUM 50 MG TA: 50 | 30 days supply | Qty: 30 | Fill #0

## 2017-09-16 NOTE — Progress Notes (Signed)
Shannon Robinson 67 y.o. female presents to office today for nurse visit: BP recheck. She is accompanied by her daughter who is translating for her today. She is taking medication as prescribed.   Blood pressure reading is 154/83 and 150/80   Per PCP-    add Losartan 50 mg by mouth daily   Recheck blood pressure in

## 2017-09-29 ENCOUNTER — Ambulatory Visit: Payer: BLUE CROSS/BLUE SHIELD

## 2017-10-15 MED FILL — PRAVASTATIN NA 40 MG TAB: 40 | 30 days supply | Qty: 30 | Fill #2

## 2017-10-15 MED FILL — AMLODIPINE BESYLATE 5 MG TA: 5 | 90 days supply | Qty: 90 | Fill #1

## 2017-10-15 MED FILL — LOSARTAN POTASSIUM 50 MG TA: 50 | 30 days supply | Qty: 30 | Fill #1

## 2017-11-17 ENCOUNTER — Ambulatory Visit: Payer: BLUE CROSS/BLUE SHIELD | Admitting: Nurse Practitioner

## 2017-12-16 ENCOUNTER — Ambulatory Visit: Payer: PPO | Attending: Nurse Practitioner | Admitting: Nurse Practitioner

## 2017-12-16 ENCOUNTER — Encounter: Payer: Self-pay | Admitting: Nurse Practitioner

## 2017-12-16 VITALS — BP 132/80 | HR 82 | Temp 98.8°F | Ht 62.0 in | Wt 119.0 lb

## 2017-12-16 DIAGNOSIS — E782 Mixed hyperlipidemia: Secondary | ICD-10-CM | POA: Insufficient documentation

## 2017-12-16 DIAGNOSIS — E559 Vitamin D deficiency, unspecified: Secondary | ICD-10-CM | POA: Insufficient documentation

## 2017-12-16 DIAGNOSIS — Z23 Encounter for immunization: Secondary | ICD-10-CM

## 2017-12-16 DIAGNOSIS — Z79899 Other long term (current) drug therapy: Secondary | ICD-10-CM | POA: Diagnosis not present

## 2017-12-16 DIAGNOSIS — M8580 Other specified disorders of bone density and structure, unspecified site: Secondary | ICD-10-CM | POA: Diagnosis not present

## 2017-12-16 DIAGNOSIS — I10 Essential (primary) hypertension: Secondary | ICD-10-CM

## 2017-12-16 MED ORDER — AMLODIPINE BESYLATE 10 MG PO TABS: 10.0000 mg | ORAL_TABLET | Freq: Every day | ORAL | 0 refills | Status: AC

## 2017-12-16 MED ORDER — PRAVASTATIN SODIUM 40 MG PO TABS: 40.0000 mg | ORAL_TABLET | Freq: Every day | ORAL | 5 refills | Status: AC

## 2017-12-16 MED FILL — AMLODIPINE BESYLATE 10 MG T: 10 | 90 days supply | Qty: 90 | Fill #0

## 2017-12-16 MED FILL — PRAVASTATIN NA 40 MG TAB: 40 | 90 days supply | Qty: 90 | Fill #0

## 2017-12-16 NOTE — Progress Notes (Signed)
Assessment & Plan:  Shannon Robinson was seen today for follow-up.  Diagnoses and all orders for this visit:  Essential hypertension, benign -     amLODipine (NORVASC) 10 MG tablet; Take 1 tablet (10 mg total) by mouth daily. -     Basic metabolic panel Continue all antihypertensives as prescribed.  Remember to bring in your blood pressure log with you for your follow up appointment.  DASH/Mediterranean Diets are healthier choices for HTN.   Mixed hyperlipidemia -     pravastatin (PRAVACHOL) 40 MG tablet; Take 1 tablet (40 mg total) by mouth daily. INSTRUCTIONS: Work on a low fat, heart healthy diet and participate in regular aerobic exercise program by working out at least 150 minutes per week; 5 days a week-30 minutes per day. Avoid red meat, fried foods. junk foods, sodas, sugary drinks, unhealthy snacking, alcohol and smoking.  Drink at least 48oz of water per day and monitor your carbohydrate intake daily.   Vitamin D deficiency disease -     VITAMIN D 25 Hydroxy (Vit-D Deficiency, Fractures)   Osteopenia Bone Density test screening  Patient has been counseled on age-appropriate routine health concerns for screening and prevention. These are reviewed and up-to-date. Referrals have been placed accordingly. Immunizations are up-to-date or declined.    Subjective:   Chief Complaint  Patient presents with  . Follow-up    Pt. is here for hypertension follow-up.    HPI [name] 67 y.o. female presents to office today for follow up to HTN and HPL. She is accompanied by her daughter who is interpreting for her today. She has a history of osteopenia based on review of bone density from 3 years ago however it does not appear she has been taking calcium or vitamin D. Also last vitamin D level was abnormally low at 8. Will recheck a blood level today.    CHRONIC HYPERTENSION Disease Monitoring  Blood pressure range BP Readings from Last 3 Encounters:  12/16/17 132/80  09/16/17 (!) 150/80    08/20/17 (!) 162/80   Chest pain: no   Dyspnea: no   Claudication: no  Medication compliance: yes, taking amlodipine 10mg  daily and losartan 50 mg daily Medication Side Effects  Lightheadedness: no   Urinary frequency: no   Edema: no   Impotence: no  Preventitive Healthcare:  Exercise: no   Diet Pattern: diet: general  Salt Restriction:  no    Hyperlipidemia Patient presents for follow up to hyperlipidemia.  She is medication compliant. She is diet compliant and denies chest pain, dyspnea, exertional chest pressure/discomfort, lower extremity edema, poor exercise tolerance and syncope or statin intolerance including myalgias.  Lab Results  Component Value Date   CHOL 206 (H) 07/14/2017   Lab Results  Component Value Date   HDL 43 07/14/2017   Lab Results  Component Value Date   LDLCALC 106 (H) 07/14/2017   Lab Results  Component Value Date   TRIG 283 (H) 07/14/2017   Lab Results  Component Value Date   CHOLHDL 4.8 (H) 07/14/2017   Lab Results  Component Value Date   LDLDIRECT 119.2 03/04/2006   Review of Systems  Constitutional: Negative for fever, malaise/fatigue and weight loss.  HENT: Negative.  Negative for nosebleeds.   Eyes: Negative.  Negative for blurred vision, double vision and photophobia.  Respiratory: Negative.  Negative for cough and shortness of breath.   Cardiovascular: Negative.  Negative for chest pain, palpitations and leg swelling.  Gastrointestinal: Negative.  Negative for heartburn, nausea  and vomiting.  Musculoskeletal: Negative.  Negative for myalgias.  Neurological: Negative.  Negative for dizziness, focal weakness, seizures and headaches.  Psychiatric/Behavioral: Negative.  Negative for suicidal ideas.    Past Medical History:  Diagnosis Date  . Hypertension     History reviewed. No pertinent surgical history.  Family History  Problem Relation Age of Onset  . Diabetes Mother   . Diabetes Father     Social History Reviewed  with no changes to be made today.   Outpatient Medications Prior to Visit  Medication Sig Dispense Refill  . fluticasone (FLONASE) 50 MCG/ACT nasal spray Place 2 sprays into both nostrils daily. 16 g 6  . losartan (COZAAR) 50 MG tablet Take 1 tablet (50 mg total) by mouth daily. 90 tablet 3  . pravastatin (PRAVACHOL) 40 MG tablet Take 1 tablet (40 mg total) by mouth daily. 30 tablet 5  . zoster vaccine live, PF, (ZOSTAVAX) 16109 UNT/0.65ML injection Inject 19,400 Units into the skin once. (Patient not taking: Reported on 07/14/2017) 1 each 0  . amLODipine (NORVASC) 10 MG tablet Take 1 tablet (10 mg total) by mouth daily. 90 tablet 0  . guaifenesin (ROBITUSSIN) 100 MG/5ML syrup Take 10 mLs (200 mg total) by mouth 3 (three) times daily as needed for cough. (Patient not taking: Reported on 12/16/2017) 120 mL 0   No facility-administered medications prior to visit.     No Known Allergies     Objective:    BP 132/80 (BP Location: Left Arm, Patient Position: Sitting, Cuff Size: Normal)   Pulse 82   Temp 98.8 F (37.1 C) (Oral)   Ht 5\' 2"  (1.575 m)   Wt 119 lb (54 kg)   SpO2 98%   BMI 21.77 kg/m  Wt Readings from Last 3 Encounters:  12/16/17 119 lb (54 kg)  08/20/17 119 lb 3.2 oz (54.1 kg)  07/14/17 122 lb 6.4 oz (55.5 kg)    Physical Exam  Constitutional: She is oriented to person, place, and time. She appears well-developed and well-nourished. She is cooperative.  HENT:  Head: Normocephalic and atraumatic.  Eyes: EOM are normal.  Neck: Normal range of motion.  Cardiovascular: Normal rate, regular rhythm, normal heart sounds and intact distal pulses. Exam reveals no gallop and no friction rub.  No murmur heard. Pulmonary/Chest: Effort normal and breath sounds normal. No tachypnea. No respiratory distress. She has no decreased breath sounds. She has no wheezes. She has no rhonchi. She has no rales. She exhibits no tenderness.  Abdominal: Bowel sounds are normal.  Musculoskeletal:  Normal range of motion. She exhibits no edema.  Neurological: She is alert and oriented to person, place, and time. Coordination normal.  Skin: Skin is warm and dry.  Psychiatric: She has a normal mood and affect. Her behavior is normal. Judgment and thought content normal.  Nursing note and vitals reviewed.      Patient has been counseled extensively about nutrition and exercise as well as the importance of adherence with medications and regular follow-up. The patient was given clear instructions to go to ER or return to medical center if symptoms don't improve, worsen or new problems develop. The patient verbalized understanding.   Follow-up: Return in about 3 months (around 03/18/2018) for HTN.   Claiborne Rigg, FNP-BC [facility] [place], [place] [phone]   12/16/2017, 3:29 PM

## 2017-12-17 ENCOUNTER — Other Ambulatory Visit: Payer: Self-pay | Admitting: Nurse Practitioner

## 2017-12-17 LAB — BASIC METABOLIC PANEL
BUN/Creatinine Ratio: 18 (ref 12–28)
BUN: 16 mg/dL (ref 8–27)
CALCIUM: 9.8 mg/dL (ref 8.7–10.3)
CHLORIDE: 100 mmol/L (ref 96–106)
CO2: 27 mmol/L (ref 20–29)
Creatinine, Ser: 0.89 mg/dL (ref 0.57–1.00)
GFR calc non Af Amer: 68 mL/min/{1.73_m2} (ref >59)
GFR, EST AFRICAN AMERICAN: 79 mL/min/{1.73_m2} (ref >59)
Glucose: 97 mg/dL (ref 65–99)
POTASSIUM: 3.9 mmol/L (ref 3.5–5.2)
Sodium: 141 mmol/L (ref 134–144)

## 2017-12-17 LAB — VITAMIN D 25 HYDROXY (VIT D DEFICIENCY, FRACTURES): Vit D, 25-Hydroxy: 13.7 ng/mL — ABNORMAL LOW (ref 30.0–100.0)

## 2017-12-17 MED ORDER — VITAMIN D (ERGOCALCIFEROL) 1.25 MG (50000 UNIT) PO CAPS
50000.0000 [IU] | ORAL_CAPSULE | ORAL | 1 refills | Status: DC
Start: 2017-12-17 — End: 2018-04-09

## 2017-12-18 ENCOUNTER — Telehealth: Payer: Self-pay

## 2017-12-18 NOTE — Telephone Encounter (Signed)
-----   Message from Claiborne RiggZelda W Fleming, NP sent at 12/17/2017 11:44 PM EDT ----- Vitamin D is still low. I have sent in a prescription to the pharmacy. You will need to pick this medication up every 30 days for the next 3 months.

## 2017-12-18 NOTE — Telephone Encounter (Signed)
CMA attempt to call patient to inform on lab results.  No answer and left a VM for patient to call back.  If patient call back, please inform:  Vitamin D is still low. I have sent in a prescription to the pharmacy. You will need to pick this medication up every 30 days for the next 3 months.  A letter will be send out to reach patient.

## 2018-01-19 MED FILL — LOSARTAN POTASSIUM 50 MG TA: 50 | 90 days supply | Qty: 90 | Fill #2

## 2018-03-18 ENCOUNTER — Encounter: Payer: Self-pay | Admitting: Nurse Practitioner

## 2018-03-18 ENCOUNTER — Ambulatory Visit: Payer: PPO | Attending: Nurse Practitioner | Admitting: Nurse Practitioner

## 2018-03-18 VITALS — BP 144/76 | HR 82 | Temp 98.0°F | Ht 62.0 in | Wt 120.8 lb

## 2018-03-18 DIAGNOSIS — Z79899 Other long term (current) drug therapy: Secondary | ICD-10-CM | POA: Diagnosis not present

## 2018-03-18 DIAGNOSIS — M810 Age-related osteoporosis without current pathological fracture: Secondary | ICD-10-CM | POA: Diagnosis not present

## 2018-03-18 DIAGNOSIS — R7303 Prediabetes: Secondary | ICD-10-CM | POA: Diagnosis not present

## 2018-03-18 DIAGNOSIS — Z780 Asymptomatic menopausal state: Secondary | ICD-10-CM

## 2018-03-18 DIAGNOSIS — E782 Mixed hyperlipidemia: Secondary | ICD-10-CM | POA: Diagnosis not present

## 2018-03-18 DIAGNOSIS — Z833 Family history of diabetes mellitus: Secondary | ICD-10-CM | POA: Insufficient documentation

## 2018-03-18 DIAGNOSIS — E559 Vitamin D deficiency, unspecified: Secondary | ICD-10-CM | POA: Diagnosis not present

## 2018-03-18 DIAGNOSIS — I10 Essential (primary) hypertension: Secondary | ICD-10-CM | POA: Insufficient documentation

## 2018-03-18 LAB — POCT GLYCOSYLATED HEMOGLOBIN (HGB A1C): HEMOGLOBIN A1C: 5.4 % (ref 4.0–5.6)

## 2018-03-18 LAB — GLUCOSE, POCT (MANUAL RESULT ENTRY): POC GLUCOSE: 106 mg/dL — AB (ref 70–99)

## 2018-03-18 MED FILL — VIT D2 1.25 MG (50,000 UNIT: 1.25 MG | 28 days supply | Qty: 4 | Fill #0

## 2018-03-18 MED FILL — AMLODIPINE BESYLATE 10 MG T: 10 | 90 days supply | Qty: 90 | Fill #0

## 2018-03-18 MED FILL — PRAVASTATIN NA 40 MG TAB: 40 | 90 days supply | Qty: 90 | Fill #1

## 2018-03-18 NOTE — Patient Instructions (Addendum)
OSTEOPENIA Please take 800 units of vitamin D daily as well as 1200mg  of calcium daily.      Preventing Osteoporosis, Adult Osteoporosis is a condition that causes the bones to get weaker. With osteoporosis, the bones become thinner, and the normal spaces in bone tissue become larger. This can make the bones weak and cause them to break more easily. People who have osteoporosis are more likely to break their wrist, spine, or hip. Even a minor accident or injury can be enough to break weak bones. Osteoporosis can occur with aging. Your body constantly replaces old bone tissue with new tissue. As you get older, you may lose bone tissue more quickly, or it may be replaced more slowly. Osteoporosis is more likely to develop if you have poor nutrition or do not get enough calcium or vitamin D. Other lifestyle factors can also play a role. By making some diet and lifestyle changes, you can help to keep your bones healthy and help to prevent osteoporosis. What nutrition changes can be made? Nutrition plays an important role in maintaining healthy, strong bones.  Make sure you get enough calcium every day from food or from calcium supplements. ? If you are age 65 or younger, aim to get 1,000 mg of calcium every day. ? If you are older than age 350, aim to get 1,200 mg of calcium every day.  Try to get enough vitamin D every day. ? If you are age 65 or younger, aim to get 600 international units (IU) every day. ? If you are older than age 65, aim to get 800 international units every day.  Follow a healthy diet. Eat plenty of foods that contain calcium and vitamin D. ? Calcium is in milk, cheese, yogurt, and other dairy products. Some fish and vegetables are also good sources of calcium. Many foods such as cereals and breads have had calcium added to them (are fortified). Check nutrition labels to see how much calcium is in a food or drink. ? Foods that contain vitamin D include milk, cereals, salmon, and  tuna. Your body also makes vitamin D when you are out in the sun. Bare skin exposure to the sun on your face, arms, legs, or back for no more than 30 minutes a day, 2 times per week is more than enough. Beyond that, it is important to use sunblock to protect your skin from sunburn, which increases your risk for skin cancer.  What lifestyle changes can be made? Making changes in your everyday life can also play an important role in preventing osteoporosis.  Stay active and get exercise every day. Ask your health care provider what types of exercise are best for you.  Do not use any products that contain nicotine or tobacco, such as cigarettes and e-cigarettes. If you need help quitting, ask your health care provider.  Limit alcohol intake to no more than 1 drink a day for nonpregnant women and 2 drinks a day for men. One drink equals 12 oz of beer, 5 oz of wine, or 1 oz of hard liquor.  Why are these changes important? Making these nutrition and lifestyle changes can:  Help you develop and maintain healthy, strong bones.  Prevent loss of bone mass and the problems that are caused by that loss, such as broken bones and delayed healing.  Make you feel better mentally and physically.  What can happen if changes are not made? Problems that can result from osteoporosis can be very serious. These may  include:  A higher risk of broken bones that are painful and do not heal well.  Physical malformations, such as a collapsed spine or a hunched back.  Problems with movement.  Where to find support: If you need help making changes to prevent osteoporosis, talk with your health care provider. You can ask for a referral to a diet and nutrition specialist (dietitian) and a physical therapist. Where to find more information: Learn more about osteoporosis from:  NIH Osteoporosis and Related [place]:  www.niams.GolfingGoddess.com.br  U.S. Office on Women's Health: SouvenirBaseball.es.html  National Osteoporosis Foundation: ProfilePeek.ch  Summary  Osteoporosis is a condition that causes weak bones that are more likely to break.  Eating a healthy diet and making sure you get enough calcium and vitamin D can help prevent osteoporosis.  Other ways to reduce your risk of osteoporosis include getting regular exercise and avoiding alcohol and products that contain nicotine or tobacco. This information is not intended to replace advice given to you by your health care provider. Make sure you discuss any questions you have with your health care provider. Document Released: 04/23/2015 Document Revised: 12/18/2015 Document Reviewed: 12/18/2015 Elsevier Interactive Patient Education  [employer].

## 2018-03-18 NOTE — Progress Notes (Signed)
Assessment & Plan:  Shannon Robinson was seen today for follow-up.  Diagnoses and all orders for this visit:  Essential hypertension -     CMP14+EGFR Continue all antihypertensives as prescribed.  Remember to bring in your blood pressure log with you for your follow up appointment.  DASH/Mediterranean Diets are healthier choices for HTN.   Prediabetes -     HgB A1c -     Glucose (CBG) Continue blood sugar control as discussed in office today, low carbohydrate diet, and regular physical exercise as tolerated, 150 minutes per week (30 min each day, 5 days per week, or 50 min 3 days per week).  Annual eye exams and foot exams are recommended. Lab Results  Component Value Date   HGBA1C 5.4 03/18/2018  Currently diet controlled and stable.    Mixed hyperlipidemia -     Lipid panel INSTRUCTIONS: Work on a low fat, heart healthy diet and participate in regular aerobic exercise program by working out at least 150 minutes per week; 5 days a week-30 minutes per day. Avoid red meat, fried foods. junk foods, sodas, sugary drinks, unhealthy snacking, alcohol and smoking.  Drink at least 48oz of water per day and monitor your carbohydrate intake daily.    Vitamin D deficiency disease -     VITAMIN D 25 Hydroxy (Vit-D Deficiency, Fractures)    Patient has been counseled on age-appropriate routine health concerns for screening and prevention. These are reviewed and up-to-date. Referrals have been placed accordingly. Immunizations are up-to-date or declined.    Subjective:   Chief Complaint  Patient presents with  . Follow-up    Pt. is here to follow-up on Hypertension.    HPI Shannon Robinson 67 y.o. female presents to office today for follow up to HTN, HPL.   Essential Hypertension Chronic. Although she endorses medication compliance taking amlodipine 27m her BP is elevated today. She currently eenies chest pain, shortness of breath, palpitations, lightheadedness, dizziness, headaches or BLE edema.   BP Readings from Last 3 Encounters:  03/18/18 (!) 144/76  12/16/17 132/80  09/16/17 (!) 150/80    Mixed Hyperlipidemia She is taking pravastatin 464mdaily, turmeric and fish oil. Will obtain fasting lipid panel today as she lipid levels are elevated and not at goal. She is diet compliant and denies statin intolerance including myalgias.  Lab Results  Component Value Date   CHOL 206 (H) 07/14/2017   Lab Results  Component Value Date   HDL 43 07/14/2017   Lab Results  Component Value Date   LDLCALC 106 (H) 07/14/2017   Lab Results  Component Value Date   TRIG 283 (H) 07/14/2017   Lab Results  Component Value Date   CHOLHDL 4.8 (H) 07/14/2017   Lab Results  Component Value Date   LDLDIRECT 119.2 03/04/2006    Review of Systems  Constitutional: Negative for fever, malaise/fatigue and weight loss.  HENT: Negative.  Negative for nosebleeds.   Eyes: Negative.  Negative for blurred vision, double vision and photophobia.  Respiratory: Negative.  Negative for cough and shortness of breath.   Cardiovascular: Negative.  Negative for chest pain, palpitations and leg swelling.  Gastrointestinal: Negative.  Negative for heartburn, nausea and vomiting.  Musculoskeletal: Negative.  Negative for myalgias.  Neurological: Negative.  Negative for dizziness, focal weakness, seizures and headaches.  Psychiatric/Behavioral: Negative.  Negative for suicidal ideas.    Past Medical History:  Diagnosis Date  . Hypertension     History reviewed. No pertinent surgical history.  Family  History  Problem Relation Age of Onset  . Diabetes Mother   . Diabetes Father     Social History Reviewed with no changes to be made today.   Outpatient Medications Prior to Visit  Medication Sig Dispense Refill  . losartan (COZAAR) 50 MG tablet Take 1 tablet (50 mg total) by mouth daily. 90 tablet 3  . pravastatin (PRAVACHOL) 40 MG tablet Take 1 tablet (40 mg total) by mouth daily. 90 tablet 5  .  Vitamin D, Ergocalciferol, (DRISDOL) 50000 units CAPS capsule Take 1 capsule (50,000 Units total) by mouth every 7 (seven) days. 12 capsule 1  . amLODipine (NORVASC) 10 MG tablet Take 1 tablet (10 mg total) by mouth daily. 90 tablet 0  . fluticasone (FLONASE) 50 MCG/ACT nasal spray Place 2 sprays into both nostrils daily. (Patient not taking: Reported on 03/18/2018) 16 g 6  . zoster vaccine live, PF, (ZOSTAVAX) 02334 UNT/0.65ML injection Inject 19,400 Units into the skin once. (Patient not taking: Reported on 07/14/2017) 1 each 0   No facility-administered medications prior to visit.     No Known Allergies     Objective:    BP (!) 144/76 (BP Location: Left Arm, Patient Position: Sitting, Cuff Size: Normal)   Pulse 82   Temp 98 F (36.7 C) (Oral)   Ht '5\' 2"'  (1.575 m)   Wt 120 lb 12.8 oz (54.8 kg)   SpO2 99%   BMI 22.09 kg/m  Wt Readings from Last 3 Encounters:  03/18/18 120 lb 12.8 oz (54.8 kg)  12/16/17 119 lb (54 kg)  08/20/17 119 lb 3.2 oz (54.1 kg)    Physical Exam  Constitutional: She is oriented to person, place, and time. She appears well-developed and well-nourished. She is cooperative.  HENT:  Head: Normocephalic and atraumatic.  Eyes: EOM are normal.  Neck: Normal range of motion.  Cardiovascular: Normal rate, regular rhythm, normal heart sounds and intact distal pulses. Exam reveals no gallop and no friction rub.  No murmur heard. Pulmonary/Chest: Effort normal and breath sounds normal. No tachypnea. No respiratory distress. She has no decreased breath sounds. She has no wheezes. She has no rhonchi. She has no rales. She exhibits no tenderness.  Abdominal: Soft. Bowel sounds are normal.  Musculoskeletal: Normal range of motion. She exhibits no edema.  Neurological: She is alert and oriented to person, place, and time. Coordination normal.  Skin: Skin is warm and dry.  Psychiatric: She has a normal mood and affect. Her behavior is normal. Judgment and thought content  normal.  Nursing note and vitals reviewed.      Patient has been counseled extensively about nutrition and exercise as well as the importance of adherence with medications and regular follow-up. The patient was given clear instructions to go to ER or return to medical center if symptoms don't improve, worsen or new problems develop. The patient verbalized understanding.   Follow-up: Return in about 3 months (around 06/18/2018).   Gildardo Pounds, FNP-BC [facility] [facility], [place]   03/18/2018, 9:35 AM

## 2018-03-19 LAB — CMP14+EGFR
A/G RATIO: 1.5 (ref 1.2–2.2)
ALT: 31 IU/L (ref 0–32)
AST: 31 IU/L (ref 0–40)
Albumin: 4.6 g/dL (ref 3.6–4.8)
Alkaline Phosphatase: 89 IU/L (ref 39–117)
BILIRUBIN TOTAL: 0.6 mg/dL (ref 0.0–1.2)
BUN / CREAT RATIO: 17 (ref 12–28)
BUN: 12 mg/dL (ref 8–27)
CHLORIDE: 100 mmol/L (ref 96–106)
CO2: 23 mmol/L (ref 20–29)
Calcium: 9.9 mg/dL (ref 8.7–10.3)
Creatinine, Ser: 0.71 mg/dL (ref 0.57–1.00)
GFR calc non Af Amer: 90 mL/min/{1.73_m2} (ref >59)
GFR, EST AFRICAN AMERICAN: 103 mL/min/{1.73_m2} (ref >59)
GLUCOSE: 91 mg/dL (ref 65–99)
Globulin, Total: 3.1 g/dL (ref 1.5–4.5)
POTASSIUM: 4.3 mmol/L (ref 3.5–5.2)
Sodium: 140 mmol/L (ref 134–144)
Total Protein: 7.7 g/dL (ref 6.0–8.5)

## 2018-03-19 LAB — LIPID PANEL
CHOL/HDL RATIO: 6.2 ratio — AB (ref 0.0–4.4)
Cholesterol, Total: 328 mg/dL — ABNORMAL HIGH (ref 100–199)
HDL: 53 mg/dL (ref >39)
LDL Calculated: 207 mg/dL — ABNORMAL HIGH (ref 0–99)
Triglycerides: 339 mg/dL — ABNORMAL HIGH (ref 0–149)
VLDL CHOLESTEROL CAL: 68 mg/dL — AB (ref 5–40)

## 2018-03-19 LAB — VITAMIN D 25 HYDROXY (VIT D DEFICIENCY, FRACTURES): Vit D, 25-Hydroxy: 9.6 ng/mL — ABNORMAL LOW (ref 30.0–100.0)

## 2018-04-09 ENCOUNTER — Other Ambulatory Visit: Payer: Self-pay | Admitting: Nurse Practitioner

## 2018-04-09 MED ORDER — VITAMIN D (ERGOCALCIFEROL) 1.25 MG (50000 UNIT) PO CAPS: 50000.0000 [IU] | ORAL_CAPSULE | ORAL | 1 refills | Status: AC

## 2018-04-09 MED ORDER — FENOFIBRATE 48 MG PO TABS: 48.0000 mg | ORAL_TABLET | Freq: Every day | ORAL | 2 refills | Status: AC

## 2018-04-21 ENCOUNTER — Telehealth: Payer: Self-pay

## 2018-04-21 NOTE — Telephone Encounter (Signed)
[employer] [place]  Id# [id number]  contacted pt to go over lab results pt didn't answer left a detailed vm informing pt of results

## 2019-06-12 ENCOUNTER — Ambulatory Visit: Payer: Medicare HMO | Attending: Internal Medicine

## 2019-06-12 DIAGNOSIS — Z23 Encounter for immunization: Secondary | ICD-10-CM | POA: Insufficient documentation

## 2019-06-12 NOTE — Progress Notes (Signed)
   Covid-19 Vaccination Clinic  Name:  GABI MCFATE    MRN: [id number] DOB: 07-12-1950  06/12/2019  Ms. Demars was observed post Covid-19 immunization for 15 minutes without incidence. She was provided with Vaccine Information Sheet and instruction to access the V-Safe system.   Ms. Runyon was instructed to call 911 with any severe reactions post vaccine: Marland Kitchen Difficulty breathing  . Swelling of your face and throat  . A fast heartbeat  . A bad rash all over your body  . Dizziness and weakness    Immunizations Administered    Name Date Dose VIS Date Route   Pfizer COVID-19 Vaccine 06/12/2019 10:43 AM 0.3 mL 04/02/2019 Intramuscular   Manufacturer: [employer], [employer]   Lot: [id number]   NDC: [address]-4

## 2019-07-06 ENCOUNTER — Ambulatory Visit: Payer: Medicare HMO | Attending: Internal Medicine

## 2019-07-06 DIAGNOSIS — Z23 Encounter for immunization: Secondary | ICD-10-CM

## 2019-07-06 NOTE — Progress Notes (Signed)
   Covid-19 Vaccination Clinic  Name:  Shannon Robinson    MRN: [id number] DOB: Apr 02, 1951  07/06/2019  [name] was observed post Covid-19 immunization for 15 minutes without incident. She was provided with Vaccine Information Sheet and instruction to access the V-Safe system.   [name] was instructed to call 911 with any severe reactions post vaccine: Marland Kitchen Difficulty breathing  . Swelling of face and throat  . A fast heartbeat  . A bad rash all over body  . Dizziness and weakness   Immunizations Administered    Name Date Dose VIS Date Route   Pfizer COVID-19 Vaccine 07/06/2019 11:30 AM 0.3 mL 04/02/2019 Intramuscular   Manufacturer: [employer], [employer]   Lot: [id number]   NDC: [address]-1

## 2020-02-15 ENCOUNTER — Other Ambulatory Visit (HOSPITAL_BASED_OUTPATIENT_CLINIC_OR_DEPARTMENT_OTHER): Payer: Self-pay | Admitting: Internal Medicine

## 2020-02-15 ENCOUNTER — Ambulatory Visit: Payer: Medicare HMO | Attending: Internal Medicine

## 2020-02-15 DIAGNOSIS — Z23 Encounter for immunization: Secondary | ICD-10-CM

## 2020-02-15 NOTE — Progress Notes (Signed)
   Covid-19 Vaccination Clinic  Name:  KALEEAH GINGERICH    MRN: [id number] DOB: 07/03/1950  02/15/2020  Ms. Asbill was observed post Covid-19 immunization for 15 minutes without incident. She was provided with Vaccine Information Sheet and instruction to access the V-Safe system. Vaccinated by [employer].  Ms. Ancheta was instructed to call 911 with any severe reactions post vaccine: Marland Kitchen Difficulty breathing  . Swelling of face and throat  . A fast heartbeat  . A bad rash all over body  . Dizziness and weakness

## 2020-12-19 DIAGNOSIS — E7849 Other hyperlipidemia: Secondary | ICD-10-CM | POA: Diagnosis not present

## 2020-12-19 DIAGNOSIS — Z1329 Encounter for screening for other suspected endocrine disorder: Secondary | ICD-10-CM | POA: Diagnosis not present

## 2020-12-19 DIAGNOSIS — Z23 Encounter for immunization: Secondary | ICD-10-CM | POA: Diagnosis not present

## 2020-12-19 DIAGNOSIS — Z1389 Encounter for screening for other disorder: Secondary | ICD-10-CM | POA: Diagnosis not present

## 2020-12-19 DIAGNOSIS — E559 Vitamin D deficiency, unspecified: Secondary | ICD-10-CM | POA: Diagnosis not present

## 2020-12-19 DIAGNOSIS — Z1231 Encounter for screening mammogram for malignant neoplasm of breast: Secondary | ICD-10-CM | POA: Diagnosis not present

## 2020-12-19 DIAGNOSIS — I10 Essential (primary) hypertension: Secondary | ICD-10-CM | POA: Diagnosis not present

## 2021-01-02 DIAGNOSIS — E7849 Other hyperlipidemia: Secondary | ICD-10-CM | POA: Diagnosis not present

## 2021-01-02 DIAGNOSIS — I10 Essential (primary) hypertension: Secondary | ICD-10-CM | POA: Diagnosis not present

## 2021-01-11 DIAGNOSIS — Z1231 Encounter for screening mammogram for malignant neoplasm of breast: Secondary | ICD-10-CM | POA: Diagnosis not present

## 2021-02-20 DIAGNOSIS — I10 Essential (primary) hypertension: Secondary | ICD-10-CM | POA: Diagnosis not present

## 2021-03-20 DIAGNOSIS — E7849 Other hyperlipidemia: Secondary | ICD-10-CM | POA: Diagnosis not present

## 2021-03-20 DIAGNOSIS — Z7189 Other specified counseling: Secondary | ICD-10-CM | POA: Diagnosis not present

## 2021-03-20 DIAGNOSIS — E559 Vitamin D deficiency, unspecified: Secondary | ICD-10-CM | POA: Diagnosis not present

## 2021-03-20 DIAGNOSIS — I10 Essential (primary) hypertension: Secondary | ICD-10-CM | POA: Diagnosis not present

## 2021-03-20 DIAGNOSIS — Z0000 Encounter for general adult medical examination without abnormal findings: Secondary | ICD-10-CM | POA: Diagnosis not present
